# Patient Record
Sex: Female | Born: 1995 | Race: Black or African American | Hispanic: No | Marital: Single | Smoking: Never smoker
Health system: Southern US, Community
[De-identification: ages and names within clinical notes are randomized; demographics above are authoritative.]

## PROBLEM LIST (undated history)

## (undated) DIAGNOSIS — E8881 Metabolic syndrome: Secondary | ICD-10-CM

## (undated) DIAGNOSIS — E669 Obesity, unspecified: Secondary | ICD-10-CM

## (undated) DIAGNOSIS — I10 Essential (primary) hypertension: Secondary | ICD-10-CM

## (undated) DIAGNOSIS — T7840XA Allergy, unspecified, initial encounter: Secondary | ICD-10-CM

## (undated) DIAGNOSIS — R7303 Prediabetes: Secondary | ICD-10-CM

## (undated) DIAGNOSIS — E88819 Insulin resistance, unspecified: Secondary | ICD-10-CM

## (undated) DIAGNOSIS — J351 Hypertrophy of tonsils: Secondary | ICD-10-CM

## (undated) DIAGNOSIS — E119 Type 2 diabetes mellitus without complications: Secondary | ICD-10-CM

## (undated) HISTORY — PX: HERNIA REPAIR: SHX51

## (undated) HISTORY — DX: Insulin resistance, unspecified: E88.819

## (undated) HISTORY — DX: Metabolic syndrome: E88.81

---
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
## 2017-04-16 ENCOUNTER — Ambulatory Visit (INDEPENDENT_AMBULATORY_CARE_PROVIDER_SITE_OTHER): Payer: No Typology Code available for payment source | Admitting: Family

## 2017-04-16 ENCOUNTER — Encounter (INDEPENDENT_AMBULATORY_CARE_PROVIDER_SITE_OTHER): Payer: Self-pay | Admitting: Family

## 2017-04-16 VITALS — BP 110/70 | HR 76 | Wt 273.2 lb

## 2017-04-16 DIAGNOSIS — R7303 Prediabetes: Secondary | ICD-10-CM | POA: Diagnosis not present

## 2017-04-16 DIAGNOSIS — L83 Acanthosis nigricans: Secondary | ICD-10-CM | POA: Diagnosis not present

## 2017-04-16 DIAGNOSIS — E8881 Metabolic syndrome: Secondary | ICD-10-CM

## 2017-04-16 LAB — POCT GLUCOSE (DEVICE FOR HOME USE): POC Glucose: 71 mg/dl (ref 70–99)

## 2017-04-16 LAB — POCT GLYCOSYLATED HEMOGLOBIN (HGB A1C): Hemoglobin A1C: 5.8

## 2017-04-16 MED ORDER — METFORMIN HCL ER 750 MG PO TB24: 1500.0000 mg | ORAL_TABLET | Freq: Every day | ORAL | 4 refills | Status: DC

## 2017-04-16 NOTE — Progress Notes (Signed)
Subjective:  Patient Name: Angela Tate Date of Birth: 06-10-1996  MRN: [id number]  Angela Tate  presents to the office today for follow-up evaluation and management of her insulin resistance, obesity, prediabetes   HISTORY OF PRESENT ILLNESS:   Angela Tate is a 21 y.o. AA female   Angela Tate was accompanied by herself.  1. Angela Tate was first diagnosed with insulin resistance when she was about 21 yo. She was started on the Metformin by her PMD (then Dr. Hart Rochester at [facility]) when she was diagnosed. Per mom she had some abnormal blood work with a fasting insulin level which was elevated and an A1C which mom cannot recall. Since diagnosis mom thinks that she has continued to gain weight. She was seen at [facility] in their clinic once for concerns regarding insulin resistance and prediabetes. However, they had a change in insurance and it was too expensive and difficult to follow up at [facility] and they were referred to our clinic for further evaluation and management.    2. The patient's last [facility] visit was on 04/2016. In the interim, she has been generally healthy.   Angela Tate has not been seen in clinic since 04/2016. 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 She reports that she has started taking Metfromin more often, she estimates she takes it 4 days per week. She denies any GI upset with Metformin. She has improved her diet by eliminating most sugar drinks, she still drinks 1 glass of sugar Cool-Aide per day. She eats fast food 1 time per week and otherwise eats home cooked meals.   Angela Tate's only complaint is that over the last week her feet have been falling asleep when she is having bowel movements. She states that it last for about 2 minutes and then is normal again. She sits with her feet crossed while using bathroom.    Diet Review  B: Frosted Flakes cereal with milk  L: Ham sandwich with a bag of chips and water S: Oreos  D: Baked chicken with potatoes.   3.  Pertinent Review of Systems:  Constitutional: The patient feels "good". The patient seems healthy and active. Eyes: Vision seems to be good. There are no recognized eye problems. Neck: The patient has no complaints of anterior neck swelling, soreness, tenderness, pressure, discomfort, or difficulty swallowing.   Heart: Heart rate increases with exercise or other physical activity. The patient has no complaints of palpitations, irregular heart beats, chest pain, or chest pressure.   Gastrointestinal: Bowel movents seem normal. The patient has no complaints of excessive hunger, acid reflux, upset stomach, stomach aches or pains, or constipation. Legs: Muscle mass and strength seem normal. There are no complaints of numbness, tingling, burning, or pain. No edema is noted.  Feet: There are no obvious foot problems. There are no complaints of numbness, tingling, burning, or pain. No edema is noted. Neurologic: There are no recognized problems with muscle movement and strength, sensation, or coordination. Reports foot "falling asleep" when having bowel movement.  Endocrine: Denies polyuria and polydipsia.  GYN/GU: no cycles on Nexplanon  PAST MEDICAL, FAMILY, AND SOCIAL HISTORY  Past Medical History:  Diagnosis Date  . Allergy   . Insulin resistance   . Obesity   . Prediabetes   . Tonsillar hypertrophy     Family History  Problem Relation Age of Onset  . Obesity Mother   . Asthma Father   . Diabetes Maternal Grandfather   . Heart disease Maternal Grandfather   .  Hyperlipidemia Maternal Grandfather   . Hypertension Maternal Grandfather   . Stroke Maternal Grandfather   . Diabetes Paternal Uncle   . Cancer Paternal Uncle   . Alcohol abuse Neg Hx   . Arthritis Neg Hx   . Birth defects Neg Hx   . COPD Neg Hx   . Depression Neg Hx   . Drug abuse Neg Hx   . Early death Neg Hx   . Hearing loss Neg Hx   . Kidney disease Neg Hx   . Learning disabilities Neg Hx   . Mental illness Neg Hx    . Mental retardation Neg Hx   . Miscarriages / Stillbirths Neg Hx   . Vision loss Neg Hx   . Varicose Veins Neg Hx      Current Outpatient Prescriptions:  .  metFORMIN (GLUCOPHAGE XR) 750 MG 24 hr tablet, Take 2 tablets (1,500 mg total) by mouth daily with supper., Disp: 60 tablet, Rfl: 4  Allergies as of 04/16/2017  . (No Known Allergies)     reports that she has never smoked. She has never used smokeless tobacco. She reports that she does not drink alcohol or use drugs. Pediatric History  Patient Guardian Status  . Mother:  Angela Tate,Angela Tate   Other Topics Concern  . Not on file   Social History Narrative   Lives with mom and brother. Dad involved sporadically. 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   Objective:  Vital Signs:  BP 110/70   Pulse 76   Wt 273 lb 3.2 oz (123.9 kg)   BMI 46.53 kg/m  No height on file for this encounter.   Ht Readings from Last 3 Encounters:  11/09/16 5' 4.25" (1.632 m) (50 %, Z= 0.00)*  04/27/16 5\' 4"  (1.626 m) (47 %, Z= -0.08)*  04/24/16 5' 4.96" (1.65 m) (62 %, Z= 0.29)*   * Growth percentiles are based on CDC 2-20 Years data.   Wt Readings from Last 3 Encounters:  04/16/17 273 lb 3.2 oz (123.9 kg) (>99 %, Z= 2.62)*  11/09/16 276 lb (125.2 kg) (>99 %, Z= 2.61)*  04/27/16 266 lb (120.7 kg) (>99 %, Z= 2.55)*   * Growth percentiles are based on CDC 2-20 Years data.   HC Readings from Last 3 Encounters:  [facility]   Body surface area is 2.37 meters squared. No height on file for this encounter. >99 %ile (Z= 2.62) based on CDC 2-20 Years weight-for-age data using vitals from 04/16/2017.    PHYSICAL EXAM: General: Well developed, well nourished but morbidly  obese female in no acute distress.  Appears stated age Head: Normocephalic, atraumatic.   Eyes:  Pupils equal and round. EOMI.   Sclera white.  No eye drainage.   Ears/Nose/Mouth/Throat: Nares patent, no nasal drainage.  Normal dentition, mucous membranes moist.  Oropharynx intact. Neck: supple, no cervical lymphadenopathy, no thyromegaly Cardiovascular: regular rate, normal S1/S2, no murmurs Respiratory: No increased work of breathing.  Lungs clear to auscultation bilaterally.  No wheezes. Abdomen: soft, nontender, nondistended. Normal bowel sounds.  No appreciable masses  Extremities: warm, well perfused, cap refill < 2 sec.   Musculoskeletal: Normal muscle mass.  Normal strength Skin: warm, dry.  No rash or lesions.  + Acanthosis to posterior  neck and axilla.  Neurologic: alert and oriented, normal speech and gait. Normal sensation to bilateral feet.     LAB DATA:   Results for orders placed or performed in visit on 04/16/17 (from the past 504 hour(s))  POCT Glucose (Device for Home Use)   Collection Time: 04/16/17  2:47 PM  Result Value Ref Range   Glucose Fasting, POC  70 - 99 mg/dL   POC Glucose 71 70 - 99 mg/dl  POCT HgB W0J   Collection Time: 04/16/17  2:58 PM  Result Value Ref Range   Hemoglobin A1C 5.8      Assessment and Plan:   ASSESSMENT:   1. Prediabetes- A1C has improved from 6.1 at last visit to 5.8% today. She is taking Metformin more consistently  2. Obesity- 3 pound weight loss. She has improved diet. She is not exercising currently.  3. Acanthosis- consistent with insulin resistance Has worsened since last visit.     PLAN:  1. Diagnostic: A1C as above. Annual labs at next visit.  2. Therapeutic: Continue Metformin XR 1500 mg daily. Encouraged her to take EVERYDAY   - lifestyle modifications. Encouraged to begin with 15 minutes of exercise 5 days per week.  3. Patient education: Discussed Prediabetes and T2DM. Reviewed her A1c and what that signifies.  Discussed lifestyle changes and set goals of 15 minutes of exercise. Reviewed healthy diet plan. Answered all questions.  4. Follow-up: 4 months     Gretchen Short, FNP-C    Level of Service: This visit lasted in excess of 25 minutes. More than 50% of the visit was devoted to counseling.

## 2017-04-16 NOTE — Patient Instructions (Signed)
-   Continue Metformin 1500 mg XL  - Exercise at least 15 minutes per day, 5 days per week  - No sugar drinks  - Limit fast food and junk food  - Follow up in 4 months

## 2017-08-19 ENCOUNTER — Ambulatory Visit (INDEPENDENT_AMBULATORY_CARE_PROVIDER_SITE_OTHER): Payer: No Typology Code available for payment source | Admitting: Family

## 2017-08-19 VITALS — BP 118/70 | HR 90 | Wt 273.2 lb

## 2017-08-19 DIAGNOSIS — R7303 Prediabetes: Secondary | ICD-10-CM

## 2017-08-19 DIAGNOSIS — L83 Acanthosis nigricans: Secondary | ICD-10-CM

## 2017-08-19 DIAGNOSIS — Z68.41 Body mass index (BMI) pediatric, greater than or equal to 95th percentile for age: Secondary | ICD-10-CM

## 2017-08-19 LAB — POCT GLYCOSYLATED HEMOGLOBIN (HGB A1C): Hemoglobin A1C: 5.9

## 2017-08-19 LAB — POCT GLUCOSE (DEVICE FOR HOME USE): POC Glucose: 58 mg/dl — AB (ref 70–99)

## 2017-08-19 NOTE — Patient Instructions (Signed)
-   Take Metformin every night  - Exercise for 10 minutes every day.  - Cut out sugar drinks- Diet is ok.   - Adult endocrinology is   - [address], [place], [place] [address]  - Phone: [phone]  - Adult patients are able to stay with pediatric until finishing college.   - Follow up in 3 months.

## 2017-08-20 ENCOUNTER — Encounter (INDEPENDENT_AMBULATORY_CARE_PROVIDER_SITE_OTHER): Payer: Self-pay | Admitting: Family

## 2017-08-20 NOTE — Progress Notes (Signed)
Subjective:  Patient Name: Angela Tate Date of Birth: 07/16/1996  MRN: [id number]  Angela Tate  presents to the office today for follow-up evaluation and management of her insulin resistance, obesity, prediabetes   HISTORY OF PRESENT ILLNESS:   Angela Tate is a 21 y.o. AA female   Angela Tate was accompanied by herself.  1. Angela Tate was first diagnosed with insulin resistance when she was about 21 yo. She was started on the Metformin by her PMD (then [facility]) when she was diagnosed. Per mom she had some abnormal blood work with a fasting insulin level which was elevated and an A1C which mom cannot recall. Since diagnosis mom thinks that she has continued to gain weight. [facility] clinic [facility] for concerns regarding insulin resistance and prediabetes. However, they had a change in insurance and it was too expensive and difficult to follow up at [facility] and they were referred to our clinic for further evaluation and management.    2. The patient's last [facility] visit was on 03/2017. In the interim, she has been generally healthy.   Angela Tate reports that she is very busy with school and has not had much time to focus on diet and exercise. She states that the only exercise she gets is on days when she has school and has to walk to various buildings on campus. When she is home she spends time studying and watching TV. She has not been able to find much motivation to exercise.   She reports that she has started drinking a lot more sugar drinks lately. She is drinking 2-3 sugar drinks per day. She eats fast food at least one time per day. For dinner she makes spaghetti almost every night because it is her favorite food. She eats fruit snacks and rice krispy treats twice per day for snack.   She is suppose to take Metformin nightly. She reports that she is only taking Metformin about 3 days per week.     3. Pertinent Review of Systems:  Constitutional: The patient feels  "ok". The patient seems healthy and active. Eyes: Vision seems to be good. There are no recognized eye problems. Neck: The patient has no complaints of anterior neck swelling, soreness, tenderness, pressure, discomfort, or difficulty swallowing.   Heart: Heart rate increases with exercise or other physical activity. The patient has no complaints of palpitations, irregular heart beats, chest pain, or chest pressure.   Gastrointestinal: Bowel movents seem normal. The patient has no complaints of excessive hunger, acid reflux, upset stomach, stomach aches or pains, or constipation. Legs: Muscle mass and strength seem normal. There are no complaints of numbness, tingling, burning, or pain. No edema is noted.  Feet: There are no obvious foot problems. There are no complaints of numbness, tingling, burning, or pain. No edema is noted. Neurologic: There are no recognized problems with muscle movement and strength, sensation, or coordination.  Endocrine: Denies polyuria and polydipsia.  GYN/GU: no cycles on Nexplanon  PAST MEDICAL, FAMILY, AND SOCIAL HISTORY  Past Medical History:  Diagnosis Date  . Allergy   . Insulin resistance   . Obesity   . Prediabetes   . Tonsillar hypertrophy     Family History  Problem Relation Age of Onset  . Obesity Mother   . Asthma Father   . Diabetes Maternal Grandfather   . Heart disease Maternal Grandfather   . Hyperlipidemia Maternal Grandfather   . Hypertension Maternal Grandfather   . Stroke Maternal Grandfather   .  Diabetes Paternal Uncle   . Cancer Paternal Uncle   . Alcohol abuse Neg Hx   . Arthritis Neg Hx   . Birth defects Neg Hx   . COPD Neg Hx   . Depression Neg Hx   . Drug abuse Neg Hx   . Early death Neg Hx   . Hearing loss Neg Hx   . Kidney disease Neg Hx   . Learning disabilities Neg Hx   . Mental illness Neg Hx   . Mental retardation Neg Hx   . Miscarriages / Stillbirths Neg Hx   . Vision loss Neg Hx   . Varicose Veins Neg Hx       Current Outpatient Medications:  .  metFORMIN (GLUCOPHAGE XR) 750 MG 24 hr tablet, Take 2 tablets (1,500 mg total) by mouth daily with supper., Disp: 60 tablet, Rfl: 4  Allergies as of 08/19/2017  . (No Known Allergies)     reports that  has never smoked. she has never used smokeless tobacco. She reports that she does not drink alcohol or use drugs. Pediatric History  Patient Guardian Status  . Mother:  Angela Tate   Other Topics Concern  . Not on file  Social History Narrative   Lives with mom and brother. Dad involved sporadically. 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   Objective:  Vital Signs:  BP 118/70   Pulse 90   Wt 273 lb 3.2 oz (123.9 kg)   BMI 46.53 kg/m  No height on file for this encounter.   Ht Readings from Last 3 Encounters:  11/09/16 5' 4.25" (1.632 m) (50 %, Z= 0.00)*  04/27/16 5\' 4"  (1.626 m) (47 %, Z= -0.08)*  04/24/16 5' 4.96" (1.65 m) (62 %, Z= 0.29)*   * Growth percentiles are based on CDC (Girls, 2-20 Years) data.   Wt Readings from Last 3 Encounters:  08/19/17 273 lb 3.2 oz (123.9 kg) (>99 %, Z= 2.65)*  04/16/17 273 lb 3.2 oz (123.9 kg) (>99 %, Z= 2.62)*  11/09/16 276 lb (125.2 kg) (>99 %, Z= 2.61)*   * Growth percentiles are based on CDC (Girls, 2-20 Years) data.   HC Readings from Last 3 Encounters:  No data found for [name]   Body surface area is 2.37 meters squared. No height on file for this encounter. >99 %ile (Z= 2.65) based on CDC (Girls, 2-20 Years) weight-for-age data using vitals from 08/19/2017.    PHYSICAL EXAM: General: Well developed, well nourished but morbidly obese female in no acute distress.  Appears stated age. She is alert and oriented.  Head:  Normocephalic, atraumatic.   Eyes:  Pupils equal and round. EOMI.   Sclera white.  No eye drainage.   Ears/Nose/Mouth/Throat: Nares patent, no nasal drainage.  Normal dentition, mucous membranes moist.  Oropharynx intact. Neck: supple, no cervical lymphadenopathy, no thyromegaly Cardiovascular: regular rate, normal S1/S2, no murmurs Respiratory: No increased work of breathing.  Lungs clear to auscultation bilaterally.  No wheezes. Abdomen: soft, nontender, nondistended. Normal bowel sounds.  No appreciable masses  Extremities: warm, well perfused, cap refill < 2 sec.   Musculoskeletal: Normal muscle mass.  Normal strength Skin: warm, dry.  No rash or lesions.  + Acanthosis to posterior neck and axilla.  Neurologic: alert and oriented,  normal speech and gait. Normal sensation to bilateral feet.     LAB DATA:   Results for orders placed or performed in visit on 08/19/17 (from the past 504 hour(s))  POCT Glucose (Device for Home Use)   Collection Time: 08/19/17  3:49 PM  Result Value Ref Range   Glucose Fasting, POC  70 - 99 mg/dL   POC Glucose 58 (A) 70 - 99 mg/dl  POCT HgB [id number]   Collection Time: 08/19/17  3:58 PM  Result Value Ref Range   Hemoglobin A1C 5.9      Assessment and Plan:   ASSESSMENT:   1. Prediabetes- Her A1c has increased from 5.8% to 5.9% today. She is not taking Metformin consistently.  2. Obesity- Her weight is stable at 273 pounds. She is >99th% for weight and BMI. She has not made improvements to diet and exercise since last visit.  3. Acanthosis- Stable and consistent with insulin resistance.  4. Hypoglycemia: low glucose today in clinic. She has not eaten in over 14 hours. Given juice in clinic.     PLAN:  1. Diagnostic: Glucose and A1c as above. Annual labs ordered.  2. Therapeutic: Take 1500 mg of Metformin XR daily   - Exercise at least 10 minutes per day with goal of 1 hour.   - Eliminate all sugar drinks form diet.  3. Patient education: Reviewed  growth chart. Discussed pathophysiology of T2DM/Prediabtes and insulin resistance. Discussed A1c. Stressed the importance of making lifestyle changes and taking Metformin daily. Reviewed diet extensively and made suggestions for improvements. Advised to start with 10 minutes of exercise per day and increase gradually. Answered questions.  4. Follow-up: 3 months   LOS: This visit lasted >25 minutes. More then 50% of the visit was devoted to counseling.   Gretchen ShortSpenser Erie Radu, FNP-C

## 2017-11-20 ENCOUNTER — Ambulatory Visit (INDEPENDENT_AMBULATORY_CARE_PROVIDER_SITE_OTHER): Payer: Self-pay | Admitting: Family

## 2017-11-22 ENCOUNTER — Ambulatory Visit (INDEPENDENT_AMBULATORY_CARE_PROVIDER_SITE_OTHER): Payer: 59 | Admitting: Family

## 2017-11-22 ENCOUNTER — Encounter (INDEPENDENT_AMBULATORY_CARE_PROVIDER_SITE_OTHER): Payer: Self-pay | Admitting: Family

## 2017-11-22 VITALS — BP 126/84 | HR 90 | Ht 64.17 in | Wt 283.0 lb

## 2017-11-22 DIAGNOSIS — Z68.41 Body mass index (BMI) pediatric, greater than or equal to 95th percentile for age: Secondary | ICD-10-CM

## 2017-11-22 DIAGNOSIS — L83 Acanthosis nigricans: Secondary | ICD-10-CM | POA: Diagnosis not present

## 2017-11-22 DIAGNOSIS — E8881 Metabolic syndrome: Secondary | ICD-10-CM

## 2017-11-22 DIAGNOSIS — R7303 Prediabetes: Secondary | ICD-10-CM

## 2017-11-22 LAB — POCT GLYCOSYLATED HEMOGLOBIN (HGB A1C): Hemoglobin A1C: 5.7

## 2017-11-22 LAB — POCT GLUCOSE (DEVICE FOR HOME USE): POC Glucose: 148 mg/dl — AB (ref 70–99)

## 2017-11-22 MED ORDER — METFORMIN HCL ER 750 MG PO TB24: 1500.0000 mg | ORAL_TABLET | Freq: Every day | ORAL | 4 refills | Status: DC

## 2017-11-22 NOTE — Patient Instructions (Signed)
Continue metformin  Take every day   - Exercise at least 20 minutes 3 days per week.   - 2 days a week--> 5-10 minutes of exercise.   - Cut out sugar drinks   - Diet drinks are fine. 0 calorie, 0 sugar

## 2017-11-24 ENCOUNTER — Encounter (INDEPENDENT_AMBULATORY_CARE_PROVIDER_SITE_OTHER): Payer: Self-pay | Admitting: Family

## 2017-11-24 NOTE — Progress Notes (Signed)
Subjective:  Patient Name: Tareva Leske Date of Birth: [birth year]  MRN: [id number]  Delilah Mulgrew  presents to the office today for follow-up evaluation and management of her insulin resistance, obesity, prediabetes   HISTORY OF PRESENT ILLNESS:   Marijean is a 22 y.o. AA female   Sanae was accompanied by herself.  1. Tomorrow was first diagnosed with insulin resistance when she was about 22 yo. She was started on the Metformin by her PMD (then Dr. Hart Rochester at [facility]) when she was diagnosed. Per mom she had some abnormal blood work with a fasting insulin level which was elevated and an A1C which mom cannot recall. Since diagnosis mom thinks that she has continued to gain weight. She was seen at [facility] in their clinic once for concerns regarding insulin resistance and prediabetes. However, they had a change in insurance and it was too expensive and difficult to follow up at [facility] and they were referred to our clinic for further evaluation and management.    2. The patient's last [facility] visit was on 07/2017. In the interim, she has been generally healthy.   Marnee has been well, she is working hard at [employer]. She is very busy with school but reports that she has not worked to make many changes to her lifestyle. She believe that for about 1 month after her last appointment she tried to exercise almost every day. She was unable to maintain it and now she goes for a walk about 1 time per week. She felt much better when she was exercising and hopes to restart. She states that her diet has not been very good. She eats fast food 5-6 times per week. She is also drinking 3-4 sugar soda's per day. She finds modifying her diet to be the hardest change.   She is suppose to take 1500 mg of Metformin daily. However, she states that it upsets her stomach so she only takes it about 3 times per week.     3. Pertinent Review of Systems:  Constitutional: The patient feels "good". The patient seems healthy and  active. Eyes: Vision seems to be good. There are no recognized eye problems. Neck: The patient has no complaints of anterior neck swelling, soreness, tenderness, pressure, discomfort, or difficulty swallowing.   Heart: Heart rate increases with exercise or other physical activity. The patient has no complaints of palpitations, irregular heart beats, chest pain, or chest pressure.   Gastrointestinal: Bowel movents seem normal. The patient has no complaints of excessive hunger, acid reflux, upset stomach, stomach aches or pains, or constipation. Legs: Muscle mass and strength seem normal. There are no complaints of numbness, tingling, burning, or pain. No edema is noted.  Feet: There are no obvious foot problems. There are no complaints of numbness, tingling, burning, or pain. No edema is noted. Neurologic: There are no recognized problems with muscle movement and strength, sensation, or coordination.  Endocrine: Denies polyuria and polydipsia.  GYN/GU: no cycles on Nexplanon  PAST MEDICAL, FAMILY, AND SOCIAL HISTORY  Past Medical History:  Diagnosis Date  . Allergy   . Insulin resistance   . Obesity   . Prediabetes   . Tonsillar hypertrophy     Family History  Problem Relation Age of Onset  . Obesity Mother   . Asthma Father   . Diabetes Maternal Grandfather   . Heart disease Maternal Grandfather   . Hyperlipidemia Maternal Grandfather   . Hypertension Maternal Grandfather   . Stroke Maternal Grandfather   . Diabetes  Paternal Uncle   . Cancer Paternal Uncle   . Alcohol abuse Neg Hx   . Arthritis Neg Hx   . Birth defects Neg Hx   . COPD Neg Hx   . Depression Neg Hx   . Drug abuse Neg Hx   . Early death Neg Hx   . Hearing loss Neg Hx   . Kidney disease Neg Hx   . Learning disabilities Neg Hx   . Mental illness Neg Hx   . Mental retardation Neg Hx   . Miscarriages / Stillbirths Neg Hx   . Vision loss Neg Hx   . Varicose Veins Neg Hx      Current Outpatient Medications:   .  metFORMIN (GLUCOPHAGE XR) 750 MG 24 hr tablet, Take 2 tablets (1,500 mg total) by mouth daily with supper., Disp: 60 tablet, Rfl: 4  Allergies as of 11/22/2017  . (No Known Allergies)     reports that  has never smoked. she has never used smokeless tobacco. She reports that she does not drink alcohol or use drugs. Pediatric History  Patient Guardian Status  . Mother:  [name]   Other Topics Concern  . Not on file  Social History Narrative   Lives with mom and brother. Dad involved sporadically. 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   Objective:  Vital Signs:  BP 126/84   Pulse 90   Ht 5' 4.17" (1.63 m)   Wt 283 lb (128.4 kg)   BMI 48.32 kg/m  Blood pressure percentiles are 89 % systolic and 98 % diastolic based on the August 2017 AAP Clinical Practice Guideline. This reading is in the Stage 1 hypertension range (BP >= 130/80).   Ht Readings from Last 3 Encounters:  11/22/17 5' 4.17" (1.63 m) (48 %, Z= -0.05)*  11/09/16 5' 4.25" (1.632 m) (50 %, Z= 0.00)*  04/27/16 5\' 4"  (1.626 m) (47 %, Z= -0.08)*   * Growth percentiles are based on CDC (Girls, 2-20 Years) data.   Wt Readings from Last 3 Encounters:  11/22/17 283 lb (128.4 kg) (>99 %, Z= 2.73)*  08/19/17 273 lb 3.2 oz (123.9 kg) (>99 %, Z= 2.65)*  04/16/17 273 lb 3.2 oz (123.9 kg) (>99 %, Z= 2.62)*   * Growth percentiles are based on CDC (Girls, 2-20 Years) data.   HC Readings from Last 3 Encounters:  No data found for [facility]   Body surface area is 2.41 meters squared. 48 %ile (Z= -0.05) based on CDC (Girls, 2-20 Years) Stature-for-age data based on Stature recorded on 11/22/2017. >99 %ile (Z= 2.73) based on CDC (Girls, 2-20 Years) weight-for-age  data using vitals from 11/22/2017.    PHYSICAL EXAM: General: Well developed, well nourished but morbidly obese female in no acute distress. She is alert and oriented.  Head: Normocephalic, atraumatic.   Eyes:  Pupils equal and round. EOMI.   Sclera white.  No eye drainage.   Ears/Nose/Mouth/Throat: Nares patent, no nasal drainage.  Normal dentition, mucous membranes moist.  Oropharynx intact. Neck: supple, no cervical lymphadenopathy, no thyromegaly Cardiovascular: regular rate, normal S1/S2, no murmurs Respiratory: No increased work of breathing.  Lungs clear to auscultation bilaterally.  No wheezes. Abdomen: soft, nontender, nondistended. Normal bowel sounds.  No appreciable masses  Extremities: warm, well  perfused, cap refill < 2 sec.   Musculoskeletal: Normal muscle mass.  Normal strength Skin: warm, dry.  No rash or lesions.  + Acanthosis to posterior neck and axilla.  Neurologic: alert and oriented, normal speech and gait. Normal sensation to bilateral feet.     LAB DATA:   Results for orders placed or performed in visit on 11/22/17 (from the past 504 hour(s))  POCT Glucose (Device for Home Use)   Collection Time: 11/22/17 10:57 AM  Result Value Ref Range   Glucose Fasting, POC  70 - 99 mg/dL   POC Glucose 621148 (A) 70 - 99 mg/dl  POCT HgB H0QA1C   Collection Time: 11/22/17 11:08 AM  Result Value Ref Range   Hemoglobin A1C 5.7      Assessment and Plan:   ASSESSMENT: Sidney AceKiarah is a 22 y.o. Female with prediabetes, morbid obesity, and acanthosis. She has not made lifestyle changes since her last visit. She has also struggled to take her Metformin consistently. Her Hemoglobin A1c remains in the prediabetes range at 5.7%. She has gained 10 pounds since her last visit and her BMI is >99%ile.   1. Prediabetes/Acanthosis/obesity  - Take 1500mg  of Metformin ER daily. Discussed taking it after she eats.   - Encouraged to take probiotic to help with GI upset  - Discussed importance of  Metformin to decrease insulin resistance and help lower A1c  - Advised to exercise at least 30 minutes per day. Her ultimate goal is 1 hour per day  - Reviewed diet extensively and made suggestions for changes.   - Cut out sugar drinks is number 1 priority at this point.  - Discussed growth chart and weight trend.  - Answered questions.    4. Follow-up: 3 months   LOS: This visit lasted >25 minutes. More then 50% of the visit was devoted to counseling and education.   Gretchen ShortSpenser Avianna Moynahan, FNP-C

## 2018-01-22 ENCOUNTER — Other Ambulatory Visit (INDEPENDENT_AMBULATORY_CARE_PROVIDER_SITE_OTHER): Payer: Self-pay | Admitting: *Deleted

## 2018-01-22 MED ORDER — METFORMIN HCL ER 750 MG PO TB24: ORAL_TABLET | ORAL | 1 refills | Status: AC

## 2018-03-24 ENCOUNTER — Ambulatory Visit (INDEPENDENT_AMBULATORY_CARE_PROVIDER_SITE_OTHER): Payer: 59 | Admitting: Family

## 2019-04-02 ENCOUNTER — Emergency Department (HOSPITAL_COMMUNITY)
Admission: EM | Admit: 2019-04-02 | Discharge: 2019-04-02 | Disposition: A | Discharge status: Home / Self Care | Payer: 59 | Attending: Emergency Medicine | Admitting: Emergency Medicine

## 2019-04-02 ENCOUNTER — Other Ambulatory Visit: Payer: Self-pay

## 2019-04-02 ENCOUNTER — Emergency Department (HOSPITAL_COMMUNITY): Payer: 59

## 2019-04-02 DIAGNOSIS — Y9301 Activity, walking, marching and hiking: Secondary | ICD-10-CM | POA: Diagnosis not present

## 2019-04-02 DIAGNOSIS — Y999 Unspecified external cause status: Secondary | ICD-10-CM | POA: Insufficient documentation

## 2019-04-02 DIAGNOSIS — Y92014 Private driveway to single-family (private) house as the place of occurrence of the external cause: Secondary | ICD-10-CM | POA: Insufficient documentation

## 2019-04-02 DIAGNOSIS — S93401A Sprain of unspecified ligament of right ankle, initial encounter: Secondary | ICD-10-CM

## 2019-04-02 DIAGNOSIS — W1830XA Fall on same level, unspecified, initial encounter: Secondary | ICD-10-CM | POA: Diagnosis not present

## 2019-04-02 DIAGNOSIS — M25571 Pain in right ankle and joints of right foot: Secondary | ICD-10-CM | POA: Diagnosis present

## 2019-04-02 NOTE — ED Triage Notes (Signed)
Pt  Here from home with c/o right ankle pain after rolling it  On her way to work

## 2019-04-02 NOTE — Discharge Instructions (Addendum)
Your x-ray showed soft tissue swelling on the outside of your ankle, no bone injury  You likely have a sprained ligament of your ankle  For pain and inflammation you can use a combination of ibuprofen and acetaminophen.  Take [phone] mg acetaminophen (tylenol) every 6 hours or 600 mg ibuprofen (advil, motrin) every 6 hours.  You can take these separately or combine them every 6 hours for maximum pain control. Do not exceed 4,000 mg acetaminophen or 2,400 mg ibuprofen in a 24 hour period.  Do not take ibuprofen containing products if you are pregnant or have history of kidney disease, ulcers, GI bleeding, severe acid reflux, or take a blood thinner.  Do not take acetaminophen if you have liver disease.   Ice for 20  mins three times a day (morning, afternoon, evening.)  Elevate your foot  Start doing range of motion exercises on day 3 to help loosen up the join and avoid weakness

## 2019-04-02 NOTE — ED Notes (Signed)
Patient transported to X-ray 

## 2019-04-02 NOTE — Progress Notes (Signed)
Orthopedic Tech Progress Note Patient Details:  VIRGIL SLINGER Aug 03, 1996 [id number]  Ortho Devices Type of Ortho Device: ASO, Crutches Ortho Device/Splint Location: LRE Ortho Device/Splint Interventions: Adjustment, Application   Post Interventions Patient Tolerated: Well, Ambulated well Instructions Provided: Care of device, Adjustment of device   Janit Pagan 04/02/2019, 1:29 PM

## 2019-04-02 NOTE — ED Provider Notes (Signed)
[facility] EMERGENCY [facility] Provider Note   CSN: [id number] Arrival date & time: 04/02/19  1109    History   Chief Complaint No chief complaint on file.   HPI Angela Tate is a 23 y.o. female presents to the ER for evaluation of right ankle pain.  Sudden onset earlier this morning.  Associated with mild swelling, pain with walking and weightbearing.  States she was walking down her driveway and slipped and fell inverting her ankle.  Apparently they recently refinished her driveway and thinks the material is still not fully set.  She denies any other physical injuries from the fall.  She denies any distal tingling or numbness, calf pain or swelling, joint redness or warmth, fevers.  No interventions.  No alleviating factors.  Aggravated with palpation, weightbearing.  No previous history of ankle injuries or surgeries.     HPI  Past Medical History:  Diagnosis Date  . Allergy   . Insulin resistance   . Obesity   . Prediabetes   . Tonsillar hypertrophy     Patient Active Problem List   Diagnosis Date Noted  . Tonsillar hypertrophy 04/27/2016  . Obstructive sleep apnea syndrome 11/23/2015  . Well adolescent visit 11/08/2015  . Prediabetes 11/05/2014  . BMI (body mass index), pediatric, > 99% for age 80/12/2014  . Acanthosis 01/20/2013  . Elevated blood pressure (not hypertension) 01/20/2013  . Well child check 10/09/2012  . Dysthymia 10/07/2012  . Body image disturbance 10/07/2012  . Dyspepsia 04/01/2012  . Impaired glucose tolerance 10/03/2011  . Obesity 10/03/2011  . Insulin resistance 07/26/2011    Class: Diagnosis of    Past Surgical History:  Procedure Laterality Date  . HERNIA REPAIR    . TONSILLECTOMY Bilateral 04/27/2016   Procedure: TONSILLECTOMY;  Surgeon: Christia Readingwight Bates, MD;  [facility];  Service: ENT;  Laterality: Bilateral;  30 mins     OB History   No obstetric history on file.      Home Medications    Prior to  Admission medications   Medication Sig Start Date End Date Taking? Authorizing Provider  metFORMIN (GLUCOPHAGE XR) 750 MG 24 hr tablet Take 2 tablets daily 01/22/18   Gretchen ShortBeasley, Spenser, NP    Family History Family History  Problem Relation Age of Onset  . Obesity Mother   . Asthma Father   . Diabetes Maternal Grandfather   . Heart disease Maternal Grandfather   . Hyperlipidemia Maternal Grandfather   . Hypertension Maternal Grandfather   . Stroke Maternal Grandfather   . Diabetes Paternal Uncle   . Cancer Paternal Uncle   . Alcohol abuse Neg Hx   . Arthritis Neg Hx   . Birth defects Neg Hx   . COPD Neg Hx   . Depression Neg Hx   . Drug abuse Neg Hx   . Early death Neg Hx   . Hearing loss Neg Hx   . Kidney disease Neg Hx   . Learning disabilities Neg Hx   . Mental illness Neg Hx   . Mental retardation Neg Hx   . Miscarriages / Stillbirths Neg Hx   . Vision loss Neg Hx   . Varicose Veins Neg Hx     Social History Social History   Tobacco Use  . Smoking status: Never Smoker  . Smokeless tobacco: Never Used  Substance Use Topics  . Alcohol use: No  . Drug use: No     Allergies   Patient has no known allergies.  Review of Systems Review of Systems  Musculoskeletal: Positive for arthralgias and joint swelling.  All other systems reviewed and are negative.    Physical Exam Updated Vital Signs BP (!) 153/120   Pulse 90   Temp 98.5 F (36.9 C) (Oral)   Resp 18   Ht 5\' 3"  (1.6 m)   SpO2 100%   BMI 50.13 kg/m   Physical Exam Constitutional:      Appearance: She is well-developed.  HENT:     Head: Normocephalic.     Nose: Nose normal.  Eyes:     General: Lids are normal.  Neck:     Musculoskeletal: Normal range of motion.  Cardiovascular:     Rate and Rhythm: Normal rate.     Comments: 1+ DP pulses bilaterally.  Toes are well perfused.  No asymmetric calf edema or tenderness. Pulmonary:     Effort: Pulmonary effort is normal. No respiratory  distress.  Musculoskeletal: Normal range of motion.        General: Swelling and tenderness present.     Comments:  Right ankle: local edema, tenderness to right malleolus, calcaneofibular and anterior talofibular ligaments.  +Tilt test. No joint erythema, warmth, fluctuance. ROM is intact but painful with inversion. No bony tenderness to midfoot, metatarsal or toes.  Achilles tendon is non tender. Negative anterior and posterior drawer.  Negative syndesmosis squeeze test. Negative Thompson test.   Neurological:     Mental Status: She is alert.     Comments: Sensation to light touch and strength is intact in right lower extremity.  Psychiatric:        Behavior: Behavior normal.      ED Treatments / Results  Labs (all labs ordered are listed, but only abnormal results are displayed) Labs Reviewed - No data to display  EKG None  Radiology Dg Ankle Complete Right  Result Date: 04/02/2019 CLINICAL DATA:  Pain following fall with twisting injury EXAM: RIGHT ANKLE - COMPLETE 3+ VIEW COMPARISON:  None. FINDINGS: Frontal, oblique, and lateral views obtained. There is soft tissue swelling. There is no appreciable fracture or joint effusion. There is no appreciable joint space narrowing or erosion. Ankle mortise appears intact. IMPRESSION: Soft tissue swelling. No fracture or appreciable arthropathy. Ankle mortise appears intact. Electronically Signed   By: Bretta BangWilliam  Woodruff III M.D.   On: 04/02/2019 11:50    Procedures Procedures (including critical care time)  Medications Ordered in ED Medications - No data to display   Initial Impression / Assessment and Plan / ED Course  I have reviewed the triage vital signs and the nursing notes.  Pertinent labs & imaging results that were available during my care of the patient were reviewed by me and considered in my medical decision making (see chart for details).   DDX includes soft tissue injury possibly involving the calcaneofibular or ATF  ligaments.  Joint is stable.  No skin injuries.  Extremity is neurovascularly intact.  History is not consistent with infectious process, gout, septic arthritis.  X-ray reviewed and interpreted by me and radiologist reveals soft tissue swelling but no arthropathy.  Patient was placed in an ASO brace, crutches and discharged with symptomatic and supportive care.  PCP follow-up for persistent symptoms.  Return precautions given.  Discussed plan of care with patient and father on the phone who are comfortable with this plan.  Final Clinical Impressions(s) / ED Diagnoses   Final diagnoses:  Sprain of right ankle, unspecified ligament, initial encounter    ED Discharge  Orders    None       Arlean Hopping 04/02/19 1258    Lacretia Leigh, MD 04/03/19 [id number]

## 2019-04-13 ENCOUNTER — Other Ambulatory Visit: Payer: Self-pay

## 2019-04-13 ENCOUNTER — Encounter: Payer: Self-pay | Admitting: Podiatry

## 2019-04-13 ENCOUNTER — Ambulatory Visit (INDEPENDENT_AMBULATORY_CARE_PROVIDER_SITE_OTHER): Payer: 59 | Admitting: Podiatry

## 2019-04-13 ENCOUNTER — Ambulatory Visit (INDEPENDENT_AMBULATORY_CARE_PROVIDER_SITE_OTHER): Payer: 59

## 2019-04-13 VITALS — Temp 98.1°F

## 2019-04-13 DIAGNOSIS — S99911A Unspecified injury of right ankle, initial encounter: Secondary | ICD-10-CM

## 2019-04-13 DIAGNOSIS — T1490XA Injury, unspecified, initial encounter: Secondary | ICD-10-CM

## 2019-04-13 DIAGNOSIS — S93401A Sprain of unspecified ligament of right ankle, initial encounter: Secondary | ICD-10-CM

## 2019-04-13 DIAGNOSIS — R7309 Other abnormal glucose: Secondary | ICD-10-CM | POA: Insufficient documentation

## 2019-04-14 MED ORDER — IBUPROFEN 800 MG PO TABS: 800.0000 mg | ORAL_TABLET | Freq: Three times a day (TID) | ORAL | 0 refills | Status: AC | PRN

## 2019-04-14 NOTE — Progress Notes (Signed)
Subjective:   Patient ID: Angela Tate, female   DOB: 23 y.o.   MRN: [id number]   HPI 23 year old female presents the office with concerns of pain to her right ankle.  She states that she did slip off a curb and she twisted her ankle about 2 weeks ago.  She was seen in the emergency department where x-rays were taken which were negative.  She was placed on crutches for 72 hours as well as a brace which is been helpful but she still states that she is having some tenderness and she is walking differently.  She still has swelling to the outside aspect of her ankle.  She is been trying ice and elevate as well.  No recent falls otherwise.   Review of Systems  All other systems reviewed and are negative.  Past Medical History:  Diagnosis Date  . Allergy   . Insulin resistance   . Obesity   . Prediabetes   . Tonsillar hypertrophy     Past Surgical History:  Procedure Laterality Date  . HERNIA REPAIR    . TONSILLECTOMY Bilateral 04/27/2016   Procedure: TONSILLECTOMY;  Surgeon: Melida Quitter, MD;  Location: [facility] OR;  Service: ENT;  Laterality: Bilateral;  30 mins     Current Outpatient Medications:  .  metFORMIN (GLUCOPHAGE XR) 750 MG 24 hr tablet, Take 2 tablets daily, Disp: 180 tablet, Rfl: 1  No Known Allergies     Objective:  Physical Exam  General: AAO x3, NAD  Dermatological: Skin is warm, dry and supple bilateral. Nails x 10 are well manicured; remaining integument appears unremarkable at this time. There are no open sores, no preulcerative lesions, no rash or signs of infection present.  Vascular: Dorsalis Pedis artery and Posterior Tibial artery pedal pulses are 2/4 bilateral with immedate capillary fill time. Pedal hair growth present. No varicosities and no lower extremity edema present bilateral. There is no pain with calf compression, swelling, warmth, erythema.   Neruologic: Grossly intact via light touch bilateral. Vibratory intact via tuning fork bilateral. Protective  threshold with Semmes Wienstein monofilament intact to all pedal sites bilateral.  Musculoskeletal: There is tenderness palpation along the course of the ATFL and the distal half of the fibula.  No proximal tib-fib pain.  No pain to the medial ankle.  No pain to the talus, fifth metatarsal base or other areas of the foot.  Localized edema to the lateral aspect of the ankle.  Muscular strength 5/5 in all groups tested bilateral.  Gait: Unassisted, Nonantalgic.      Assessment:   Right ankle sprain     Plan:  -Treatment options discussed including all alternatives, risks, and complications -Etiology of symptoms were discussed -Repeat x-rays were obtained and reviewed of the foot and ankle.  No evidence of acute fracture but there is swelling present.  On the oblique view of the ankle distally there is a small radiolucent line however it is more of a shadow as opposed to fracture. -Given the amount of discomfort she is having I still recommend immobilization in a cam boot. -Prescribed ibuprofen 800 mg to take as needed.  Continue to ice elevate. -We will start physical therapy next week.  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  Trula Slade DPM

## 2019-05-04 ENCOUNTER — Ambulatory Visit (INDEPENDENT_AMBULATORY_CARE_PROVIDER_SITE_OTHER): Payer: 59 | Admitting: Podiatry

## 2019-05-04 ENCOUNTER — Other Ambulatory Visit: Payer: Self-pay

## 2019-05-04 VITALS — Temp 97.2°F

## 2019-05-04 DIAGNOSIS — S93401D Sprain of unspecified ligament of right ankle, subsequent encounter: Secondary | ICD-10-CM

## 2019-05-06 NOTE — Progress Notes (Signed)
Subjective: 23 year old female presents the office today for evaluation of right ankle sprain.  Overall she is doing much better she returned to work.  She still doing physical therapy.  She has no new concerns. Denies any systemic complaints such as fevers, chills, nausea, vomiting. No acute changes since last appointment, and no other complaints at this time.   Objective: AAO x3, NAD DP/PT pulses palpable bilaterally, CRT less than 3 seconds On today's exam I am not able to elicit any area tenderness there is no pain on the course the lateral ankle ligaments.  Anterior drawer, talar tilt test is negative.  No edema there is no erythema or warmth. No open lesions or pre-ulcerative lesions.  No pain with calf compression, swelling, warmth, erythema  Assessment: Right ankle sprain, improving  Plan: -All treatment options discussed with the patient including all alternatives, risks, complications.  -Overall she is doing much better.  Recommend physical therapy.  She is back to work not having any issues.  Aspect.  Following questions concerns. -Patient encouraged to call the office with any questions, concerns, change in symptoms.   Trula Slade DPM

## 2020-12-08 ENCOUNTER — Encounter: Payer: 59 | Attending: Physician Assistant | Admitting: Dietician

## 2020-12-08 ENCOUNTER — Other Ambulatory Visit: Payer: Self-pay

## 2020-12-08 ENCOUNTER — Encounter: Payer: Self-pay | Admitting: Dietician

## 2020-12-08 DIAGNOSIS — Z6841 Body Mass Index (BMI) 40.0 and over, adult: Secondary | ICD-10-CM | POA: Diagnosis not present

## 2020-12-08 DIAGNOSIS — E8881 Metabolic syndrome: Secondary | ICD-10-CM | POA: Diagnosis not present

## 2020-12-08 DIAGNOSIS — E119 Type 2 diabetes mellitus without complications: Secondary | ICD-10-CM

## 2020-12-08 NOTE — Progress Notes (Signed)
Patient was seen on 12/08/2020 for the first of a series of three diabetes self-management courses at [facility].  Patient Education Plan per assessed needs and concerns is to attend three course education program for Diabetes Self Management Education.  The following learning objectives were met by the patient during this class:  Describe diabetes, types of diabetes and pathophysiology  State some common risk factors for diabetes  Defines the role of glucose and insulin  Describe the relationship between diabetes and cardiovascular and other risks  State the members of the Healthcare Team  States the rationale for glucose monitoring and when to test  State their individual [place] the importance of logging glucose readings and how to interpret the readings  Identifies A1C target  Explain the correlation between A1c and eAG values  State symptoms and treatment of high blood glucose and low blood glucose  Explain proper technique for glucose testing and identify proper sharps disposal  Handouts given during class include:  How to Thrive:  A Guide for Your Journey with Diabetes by the ADA  Meal Plan Card and carbohydrate content list  Dietary intake form  Low Sodium Flavoring Tips  Types of Fats  Dining Out  Label reading  Snack list  Planning a balanced meal  The diabetes portion plate  Diabetes Resources  A1c to eAG Conversion Chart  Blood Glucose Log  Diabetes Recommended Care Schedule  Support Group  Diabetes Success Plan  Core Class Satisfaction Survey   Follow-Up Plan:  Attend core 2

## 2020-12-15 ENCOUNTER — Other Ambulatory Visit: Payer: Self-pay

## 2020-12-15 ENCOUNTER — Encounter: Payer: Self-pay | Admitting: Dietician

## 2020-12-15 ENCOUNTER — Encounter: Payer: 59 | Admitting: Dietician

## 2020-12-15 DIAGNOSIS — E119 Type 2 diabetes mellitus without complications: Secondary | ICD-10-CM

## 2020-12-15 DIAGNOSIS — E8881 Metabolic syndrome: Secondary | ICD-10-CM | POA: Diagnosis not present

## 2020-12-15 NOTE — Progress Notes (Signed)
Patient was seen on 12/15/2020 for the second of a series of three diabetes self-management courses at the [facility]. The following learning objectives were met by the patient during this class:   Describe the role of different macronutrients on glucose  Explain how carbohydrates affect blood glucose  State what foods contain the most carbohydrates  Demonstrate carbohydrate counting  Demonstrate how to read Nutrition Facts food label  Describe effects of various fats on heart health  Describe the importance of good nutrition for health and healthy eating strategies  Describe techniques for managing your shopping, cooking and meal planning  List strategies to follow meal plan when dining out  Describe the effects of alcohol on glucose and how to use it safely  Goals:  Follow Diabetes Meal Plan as instructed  Aim to spread carbs evenly throughout the day  Aim for 3 meals per day and snacks as needed Include lean protein foods to meals/snacks  Monitor glucose levels as instructed by your doctor   Follow-Up Plan:  Attend Core 3  Work towards following your personal food plan.   

## 2020-12-22 ENCOUNTER — Encounter: Payer: 59 | Admitting: Dietician

## 2020-12-22 ENCOUNTER — Encounter: Payer: Self-pay | Admitting: Dietician

## 2020-12-22 ENCOUNTER — Other Ambulatory Visit: Payer: Self-pay

## 2020-12-22 DIAGNOSIS — E8881 Metabolic syndrome: Secondary | ICD-10-CM | POA: Diagnosis not present

## 2020-12-22 DIAGNOSIS — E119 Type 2 diabetes mellitus without complications: Secondary | ICD-10-CM

## 2020-12-22 NOTE — Progress Notes (Signed)
Patient was seen on 12/22/2020 for the third of a series of three diabetes self-management courses at the [facility].   Angela Tate the amount of activity recommended for healthy living . Describe activities suitable for individual needs . Identify ways to regularly incorporate activity into daily life . Identify barriers to activity and ways to over come these barriers  Identify diabetes medications being personally used and their primary action for lowering glucose and possible side effects . Describe role of stress on blood glucose and develop strategies to address psychosocial issues . Identify diabetes complications and ways to prevent them  Explain how to manage diabetes during illness . Evaluate success in meeting personal goal . Establish 2-3 goals that they will plan to diligently work on  Goals:   I will count my carb choices at most meals and snacks  I will be active 30 minutes or more 5 times a week, or start small (10 minutes at a time)  I will take my diabetes medications as scheduled  I will eat less unhealthy fats by eating less sugary and fatty foods, eat more non starchy vegetables   Your patient has identified these potential barriers to change:  Motivation   Your patient has identified their diabetes self-care support plan as  Family Support    Plan:  Attend Support Group as desired

## 2021-03-01 ENCOUNTER — Ambulatory Visit: Payer: 59 | Admitting: Dietician

## 2022-01-01 ENCOUNTER — Other Ambulatory Visit: Payer: Self-pay | Admitting: Physician Assistant

## 2022-01-01 DIAGNOSIS — R221 Localized swelling, mass and lump, neck: Secondary | ICD-10-CM

## 2022-01-02 ENCOUNTER — Ambulatory Visit
Admission: RE | Admit: 2022-01-02 | Discharge: 2022-01-02 | Disposition: A | Discharge status: Home / Self Care | Payer: 59 | Source: Ambulatory Visit | Attending: Physician Assistant | Admitting: Physician Assistant

## 2022-01-02 DIAGNOSIS — R221 Localized swelling, mass and lump, neck: Secondary | ICD-10-CM

## 2022-01-03 ENCOUNTER — Other Ambulatory Visit: Payer: Self-pay | Admitting: Physician Assistant

## 2022-01-03 DIAGNOSIS — R221 Localized swelling, mass and lump, neck: Secondary | ICD-10-CM

## 2022-02-05 ENCOUNTER — Ambulatory Visit
Admission: RE | Admit: 2022-02-05 | Discharge: 2022-02-05 | Disposition: A | Discharge status: Home / Self Care | Payer: 59 | Source: Ambulatory Visit | Attending: Physician Assistant | Admitting: Physician Assistant

## 2022-02-05 DIAGNOSIS — R221 Localized swelling, mass and lump, neck: Secondary | ICD-10-CM

## 2022-02-05 MED ORDER — IOPAMIDOL (ISOVUE-300) INJECTION 61%
80.0000 mL | Freq: Once | INTRAVENOUS | Status: AC | PRN
  Administered 2022-02-05: 80 mL via INTRAVENOUS

## 2022-11-01 ENCOUNTER — Ambulatory Visit (HOSPITAL_COMMUNITY): Payer: 59

## 2022-12-15 IMAGING — CT CT NECK W/ CM
3 of 6 series · 11 of 33 positions shown, 13 images · IV contrast (agent unspecified)
Comparison: Correlation made with prior ultrasound

CLINICAL DATA: Neck mass; abnormal ultrasound

EXAM:
CT NECK WITH CONTRAST
TECHNIQUE: Multidetector CT imaging of the neck was performed using the
standard protocol following the bolus administration of intravenous
contrast.

[Series 2: neck 2.00 br40 s3 st/ no angle · axial · 0.53mm/px · z∈[-754,-654]mm · 3 of 101 slices shown, 4 images]
[im 26/101  soft-tissue]
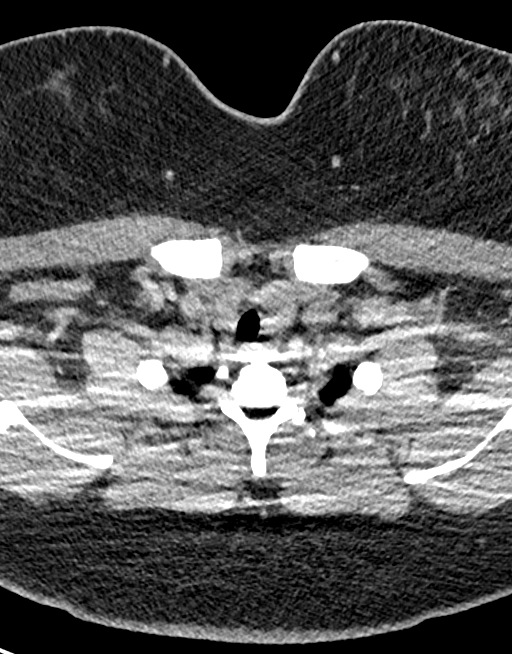
[im 26/101  bone]
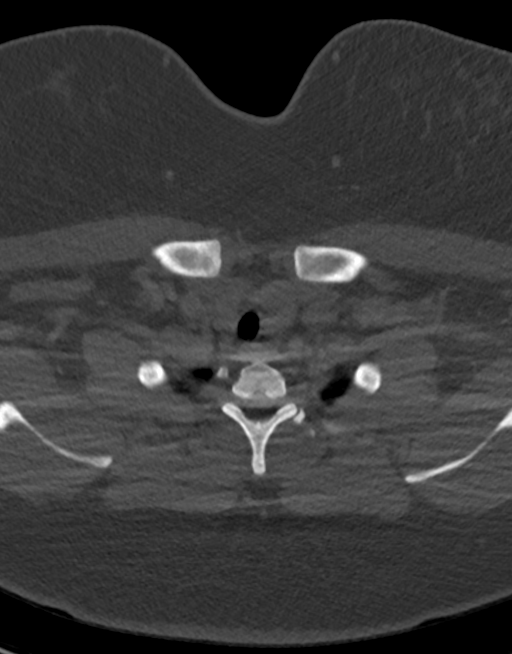
[im 51/101  bone]
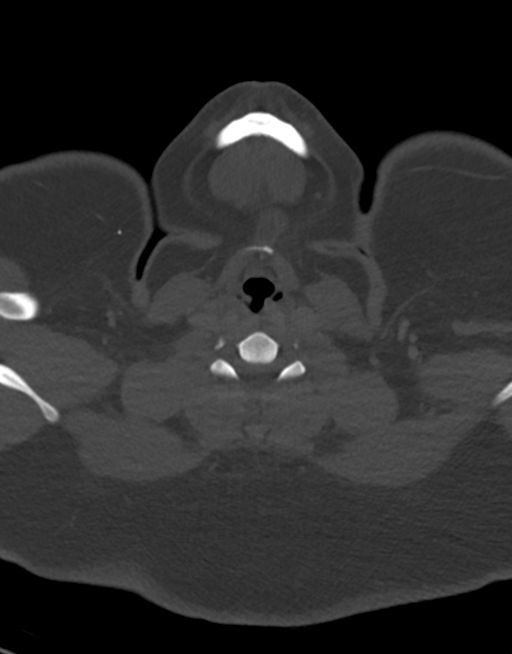
[im 76/101  bone]
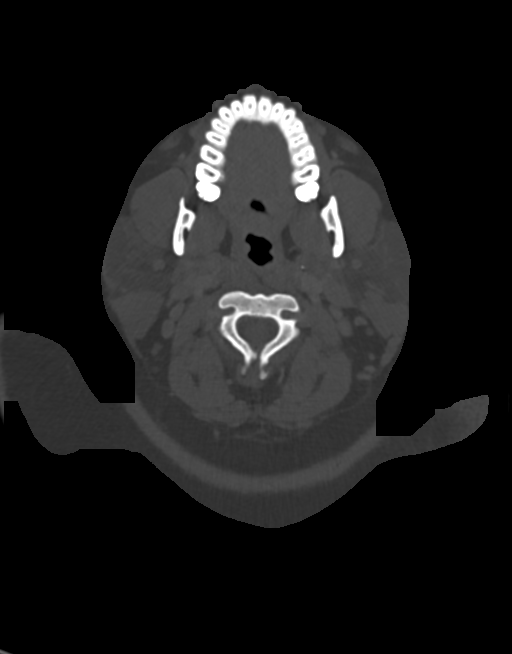

[Series 10: neck 2.00 br40 s3 (person_name) · coronal · 0.40mm/px · 3 of 186 slices shown (1 of 2)]
[im 38/186  bone]
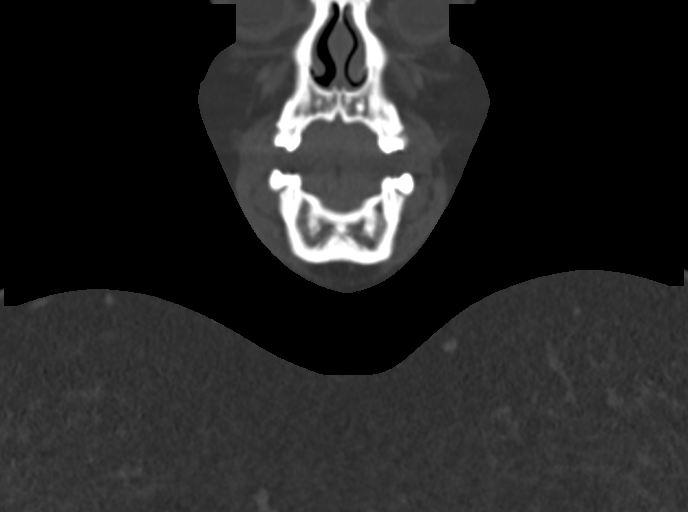
[im 75/186  bone]
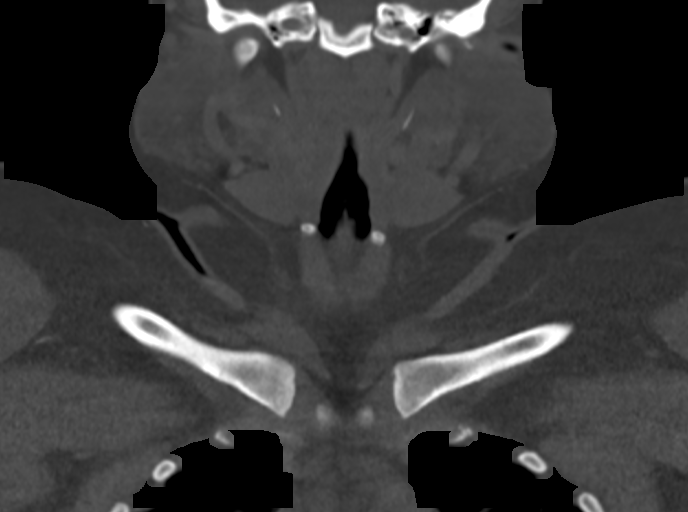
[im 112/186  bone]
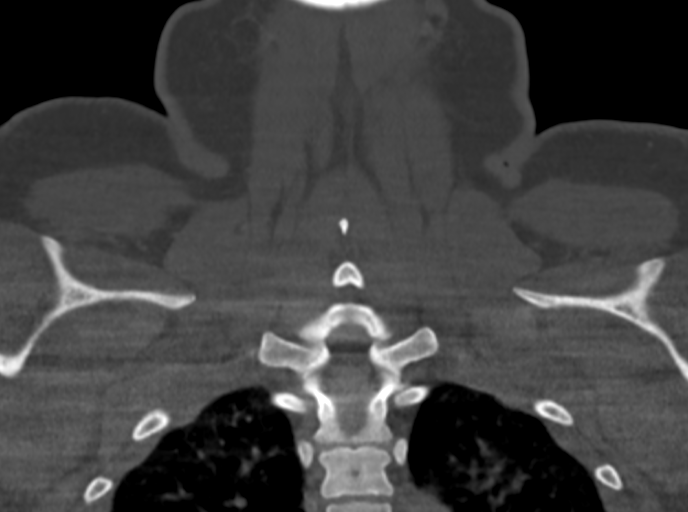

[Series 12: neck 2.00 br40 s3 (person_name) · sagittal · 0.40mm/px · 5 of 136 slices shown, 6 images (2 of 2)]
[im 46/136  bone]
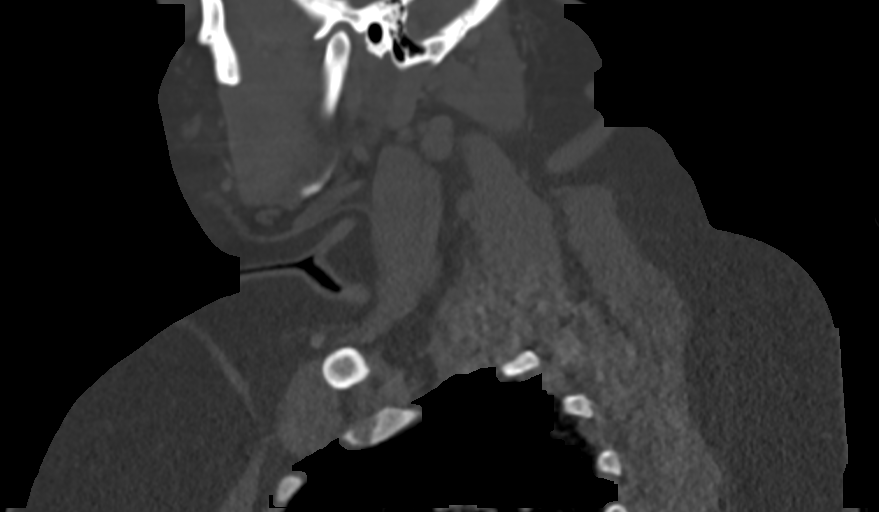
[im 57/136  bone]
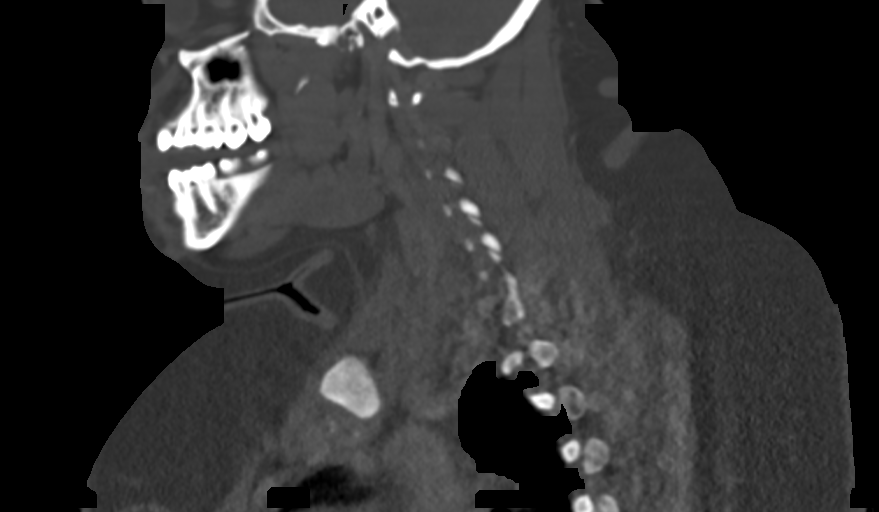
[im 68/136  soft-tissue]
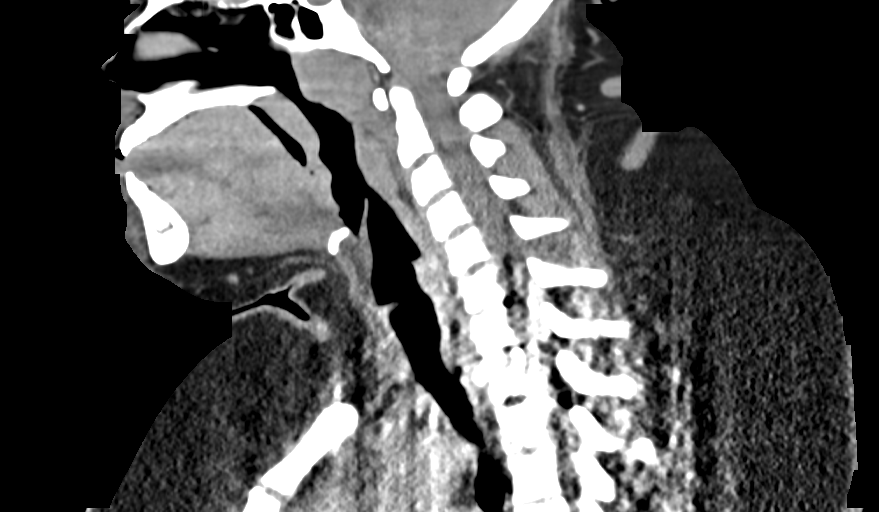
[im 68/136  bone]
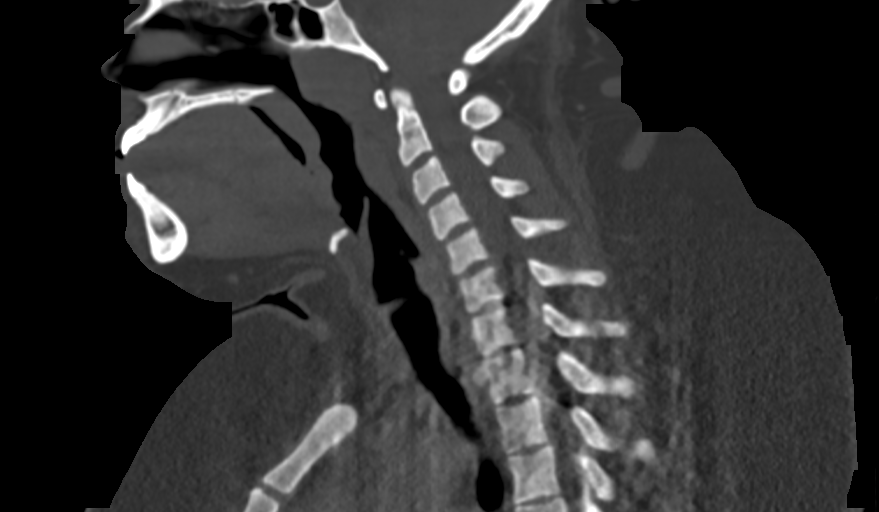
[im 79/136  bone]
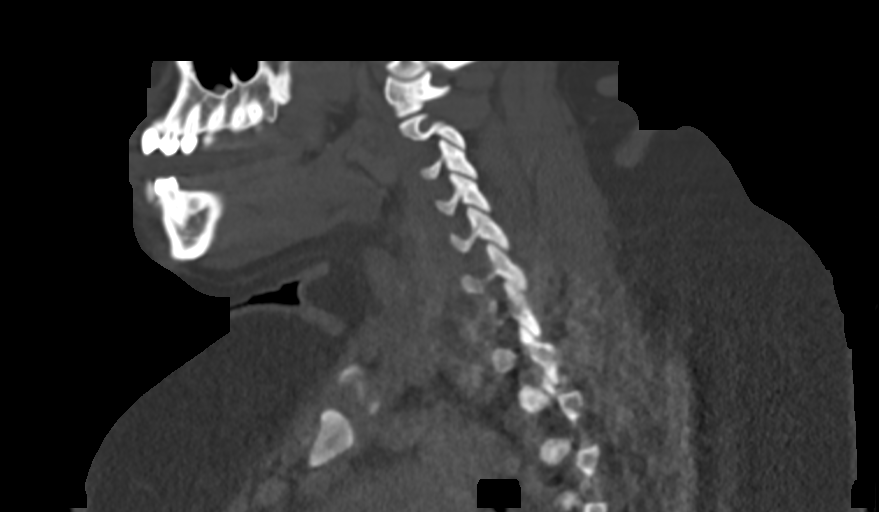
[im 91/136  bone]
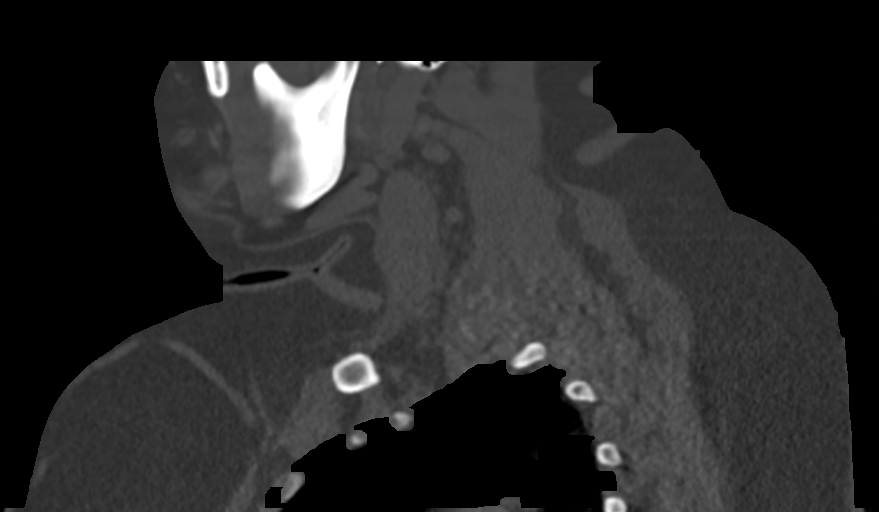

[11 of 33 positions shown; findings below may reference images not displayed]

RADIATION DOSE REDUCTION: This exam was performed according to the
departmental dose-optimization program which includes automated
exposure control, adjustment of the mA and/or kV according to
patient size and/or use of iterative reconstruction technique.

CONTRAST:  80mL XQVK32-WVV IOPAMIDOL (XQVK32-WVV) INJECTION 61%
FINDINGS: Streak artifact due to body habitus.

Pharynx and larynx: Prominent soft tissue at the posterior
nasopharyngeal wall may reflect lymphoid hyperplasia. Otherwise
unremarkable.

Salivary glands: Parotid and submandibular glands are unremarkable.

Thyroid: Poorly evaluated due to streak artifact.

Lymph nodes: No enlarged or abnormal density nodes.

Vascular: Suboptimal vessel enhancement.  No abnormality identified.

Limited intracranial: Unremarkable.

Visualized orbits: Unremarkable.

Mastoids and visualized paranasal sinuses: Included mastoid air
cells are clear. Paranasal sinus mucosal thickening.

Skeleton: No significant abnormality.

Upper chest: Included upper lungs are clear.

Other: Ventral to the hyoid bone, there is a midline soft tissue
density lesion measuring about 19 x 15 x 9 mm. This underlies the
skin marker and likely corresponds to abnormality on ultrasound.
IMPRESSION: 1.9 cm lesion ventral to the hyoid bone. This most likely represents
a thyroglossal duct cyst with increased density reflecting prior
inflammation. Suggest clinical follow-up since palpable.

Enlargement of the adenoids.

## 2023-08-21 ENCOUNTER — Ambulatory Visit
Admission: EM | Admit: 2023-08-21 | Discharge: 2023-08-21 | Disposition: A | Discharge status: Home / Self Care | Payer: 59 | Attending: Internal Medicine | Admitting: Internal Medicine

## 2023-08-21 DIAGNOSIS — M25571 Pain in right ankle and joints of right foot: Secondary | ICD-10-CM

## 2023-08-21 DIAGNOSIS — S93401A Sprain of unspecified ligament of right ankle, initial encounter: Secondary | ICD-10-CM | POA: Diagnosis not present

## 2023-08-21 DIAGNOSIS — S93409A Sprain of unspecified ligament of unspecified ankle, initial encounter: Secondary | ICD-10-CM

## 2023-08-21 DIAGNOSIS — M25471 Effusion, right ankle: Secondary | ICD-10-CM | POA: Diagnosis not present

## 2023-08-21 HISTORY — DX: Essential (primary) hypertension: I10

## 2023-08-21 HISTORY — DX: Type 2 diabetes mellitus without complications: E11.9

## 2023-08-21 MED ORDER — NAPROXEN 500 MG PO TABS: 500.0000 mg | ORAL_TABLET | Freq: Two times a day (BID) | ORAL | 0 refills | Status: AC

## 2023-08-21 NOTE — ED Provider Notes (Signed)
[facility]  Note:  This document was prepared using Conservation officer, historic buildings and may include unintentional dictation errors.  MRN: [id number] DOB: [birth year]  Subjective:   Angela Tate is a 27 y.o. female presenting for 1 day history of a right ankle injury.  Patient stepped off of a curb and rolled her ankle.  Has since had swelling and pain.  Can bear weight but with pain.  No current facility-administered medications for this encounter.  Current Outpatient Medications:    atorvastatin (LIPITOR) 10 MG tablet, Take 10 mg by mouth daily., Disp: , Rfl:    Insulin Degludec (TRESIBA FLEXTOUCH [place]), Inject into the skin., Disp: , Rfl:    losartan (COZAAR) 50 MG tablet, Take 50 mg by mouth daily., Disp: , Rfl:    ibuprofen (ADVIL) 800 MG tablet, Take 1 tablet (800 mg total) by mouth every 8 (eight) hours as needed., Disp: 30 tablet, Rfl: 0   metFORMIN (GLUCOPHAGE XR) 750 MG 24 hr tablet, Take 2 tablets daily, Disp: 180 tablet, Rfl: 1   No Known Allergies  Past Medical History:  Diagnosis Date   Allergy    Diabetes mellitus without complication (HCC)    Hypertension    Insulin resistance    Obesity    Prediabetes    Tonsillar hypertrophy      Past Surgical History:  Procedure Laterality Date   HERNIA REPAIR     TONSILLECTOMY Bilateral 04/27/2016   Procedure: TONSILLECTOMY;  Surgeon: Christia Reading, MD;  Location: [facility] OR;  Service: ENT;  Laterality: Bilateral;  30 mins    Family History  Problem Relation Age of Onset   Obesity Mother    Asthma Father    Diabetes Maternal Grandfather    Heart disease Maternal Grandfather    Hyperlipidemia Maternal Grandfather    Hypertension Maternal Grandfather    Stroke Maternal Grandfather    Diabetes Paternal Uncle    Cancer Paternal Uncle    Alcohol abuse Neg Hx    Arthritis Neg Hx    Birth defects Neg Hx    COPD Neg Hx    Depression Neg Hx    Drug abuse Neg Hx    Early death Neg Hx    Hearing  loss Neg Hx    Kidney disease Neg Hx    Learning disabilities Neg Hx    Mental illness Neg Hx    Mental retardation Neg Hx    Miscarriages / Stillbirths Neg Hx    Vision loss Neg Hx    Varicose Veins Neg Hx     Social History   Tobacco Use   Smoking status: Never   Smokeless tobacco: Never  Vaping Use   Vaping status: Never Used  Substance Use Topics   Alcohol use: No   Drug use: No    ROS   Objective:   Vitals: BP (!) 151/86 (BP Location: Right Arm)   Pulse (!) 104   Temp 98.1 F (36.7 C) (Oral)   Resp 20   SpO2 95%   Physical Exam Constitutional:      General: She is not in acute distress.    Appearance: Normal appearance. She is well-developed. She is not ill-appearing, toxic-appearing or diaphoretic.  HENT:     Head: Normocephalic and atraumatic.     Nose: Nose normal.     Mouth/Throat:     Mouth: Mucous membranes are moist.  Eyes:     General: No scleral icterus.  Right eye: No discharge.        Left eye: No discharge.     Extraocular Movements: Extraocular movements intact.  Cardiovascular:     Rate and Rhythm: Normal rate.  Pulmonary:     Effort: Pulmonary effort is normal.  Musculoskeletal:     Right ankle: Swelling (laterally) present. No deformity or ecchymosis.  Skin:    General: Skin is warm and dry.  Neurological:     General: No focal deficit present.     Mental Status: She is alert and oriented to person, place, and time.  Psychiatric:        Mood and Affect: Mood normal.        Behavior: Behavior normal.    Patient provided with crutches.  Right ankle wrapped using 4" Ace wrap in figure-8 method.   Assessment and Plan :   PDMP not reviewed this encounter.  1. Pain and swelling of right ankle   2. Sprain of ankle, initial encounter    Patient declined x-ray since we do not have a radiology tech onsite.  Will manage for ankle sprain with rice method, NSAID. Counseled patient on potential for adverse effects with medications  prescribed/recommended today, ER and return-to-clinic precautions discussed, patient verbalized understanding.    Wallis Bamberg, PA-C 08/21/23 1159

## 2023-08-21 NOTE — ED Triage Notes (Signed)
Pt states she injured right ankle when she stepped off curb last night-NAD-limping gait

## 2023-10-15 ENCOUNTER — Inpatient Hospital Stay: Payer: 59

## 2023-10-15 ENCOUNTER — Other Ambulatory Visit: Payer: Self-pay

## 2023-10-15 ENCOUNTER — Encounter: Payer: Self-pay | Admitting: Hematology

## 2023-10-15 ENCOUNTER — Inpatient Hospital Stay: Payer: 59 | Attending: Hematology | Admitting: Hematology

## 2023-10-15 VITALS — BP 156/111 | HR 112 | Temp 97.8°F | Resp 18 | Ht 63.0 in | Wt 321.3 lb

## 2023-10-15 DIAGNOSIS — I10 Essential (primary) hypertension: Secondary | ICD-10-CM | POA: Insufficient documentation

## 2023-10-15 DIAGNOSIS — D649 Anemia, unspecified: Secondary | ICD-10-CM

## 2023-10-15 DIAGNOSIS — D509 Iron deficiency anemia, unspecified: Secondary | ICD-10-CM | POA: Diagnosis not present

## 2023-10-15 DIAGNOSIS — D75839 Thrombocytosis, unspecified: Secondary | ICD-10-CM | POA: Insufficient documentation

## 2023-10-15 DIAGNOSIS — Z1321 Encounter for screening for nutritional disorder: Secondary | ICD-10-CM

## 2023-10-15 DIAGNOSIS — E119 Type 2 diabetes mellitus without complications: Secondary | ICD-10-CM | POA: Diagnosis not present

## 2023-10-15 LAB — RETIC PANEL
Immature Retic Fract: 25.9 % — ABNORMAL HIGH (ref 2.3–15.9)
RBC.: 5.18 MIL/uL — ABNORMAL HIGH (ref 3.87–5.11)
Retic Count, Absolute: 120.7 10*3/uL (ref 19.0–186.0)
Retic Ct Pct: 2.3 % (ref 0.4–3.1)
Reticulocyte Hemoglobin: 27.4 pg — ABNORMAL LOW (ref >27.9)

## 2023-10-15 LAB — CBC WITH DIFFERENTIAL (CANCER CENTER ONLY)
Abs Immature Granulocytes: 0.13 10*3/uL — ABNORMAL HIGH (ref 0.00–0.07)
Basophils Absolute: 0.1 10*3/uL (ref 0.0–0.1)
Basophils Relative: 1 %
Eosinophils Absolute: 0.2 10*3/uL (ref 0.0–0.5)
Eosinophils Relative: 2 %
HCT: 39 % (ref 36.0–46.0)
Hemoglobin: 12.2 g/dL (ref 12.0–15.0)
Immature Granulocytes: 1 %
Lymphocytes Relative: 29 %
Lymphs Abs: 3.7 10*3/uL (ref 0.7–4.0)
MCH: 23.9 pg — ABNORMAL LOW (ref 26.0–34.0)
MCHC: 31.3 g/dL (ref 30.0–36.0)
MCV: 76.5 fL — ABNORMAL LOW (ref 80.0–100.0)
Monocytes Absolute: 0.6 10*3/uL (ref 0.1–1.0)
Monocytes Relative: 4 %
Neutro Abs: 7.9 10*3/uL — ABNORMAL HIGH (ref 1.7–7.7)
Neutrophils Relative %: 63 %
Platelet Count: 544 10*3/uL — ABNORMAL HIGH (ref 150–400)
RBC: 5.1 MIL/uL (ref 3.87–5.11)
RDW: 15.9 % — ABNORMAL HIGH (ref 11.5–15.5)
WBC Count: 12.5 10*3/uL — ABNORMAL HIGH (ref 4.0–10.5)
nRBC: 0 % (ref 0.0–0.2)

## 2023-10-15 LAB — IRON AND IRON BINDING CAPACITY (CC-WL,HP ONLY)
Iron: 30 ug/dL (ref 28–170)
Saturation Ratios: 6 % — ABNORMAL LOW (ref 10.4–31.8)
TIBC: 484 ug/dL — ABNORMAL HIGH (ref 250–450)
UIBC: 454 ug/dL

## 2023-10-15 LAB — VITAMIN D 25 HYDROXY (VIT D DEFICIENCY, FRACTURES): Vit D, 25-Hydroxy: 11.68 ng/mL — ABNORMAL LOW (ref 30–100)

## 2023-10-15 LAB — FOLATE: Folate: 6.6 ng/mL (ref >5.9)

## 2023-10-15 NOTE — Progress Notes (Signed)
[facility]   [phone] [phone]   Clinic New Consult Note   Patient Care Team: Milus Height, [name] as PCP - General (Nurse Practitioner) 10/15/2023  CHIEF COMPLAINTS/PURPOSE OF CONSULTATION:  Thrombocytosis  REFERRING PHYSICIAN: Dr. Ardean Larsen   Discussed the use of AI scribe software for clinical note transcription with the patient, who gave verbal consent to proceed.  History of Present Illness   A 28 year old individual with a history of diabetes and hypertension presents for evaluation of thrombocytosis.  She presents to clinic with her grandmother.    She has had mild thrombocytosis with platelet in 500-600 range for more than 7 years.  Her first CBC in our system in August 2017 showed hemoglobin 11.1, MCV 68.6, platelet of 581, WBC 9.6.  According to her lab work from her gynecologist, her CBC from April and September last year both showed slightly elevated platelet count in 500's, and mild anemia with hemoglobin at 11.5 and slightly elevated white blood cell count at 13.2. The patient denies any history of blood clots.  The patient has been on birth control for about six years, which has regulated her menstrual cycle. However, the duration of her menstrual period varies, lasting from a day to almost two weeks, with an average of about five days. The patient reports that her periods were heavier before starting birth control. She also noted that her last period, about a month ago, was heavier than usual, with two days of going through pads very quickly.  The patient has a significant family history of cancer, with an uncle who had non-Hodgkin's lymphoma and multiple relatives on her maternal side who had breast cancer. However, genetic testing in the family has not revealed any positive results. The patient denies any use of alcohol, tobacco, or other drugs.         MEDICAL HISTORY:  Past Medical History:  Diagnosis Date   Allergy     Diabetes mellitus without complication (HCC)    Hypertension    Insulin resistance    Obesity    Prediabetes    Tonsillar hypertrophy     SURGICAL HISTORY: Past Surgical History:  Procedure Laterality Date   HERNIA REPAIR     TONSILLECTOMY Bilateral 04/27/2016   Procedure: TONSILLECTOMY;  Surgeon: Christia Reading, MD;  Location: [facility] OR;  Service: ENT;  Laterality: Bilateral;  30 mins    SOCIAL HISTORY: Social History   Socioeconomic History   Marital status: Single    Spouse name: Not on file   Number of children: 0   Years of education: Not on file   Highest education level: Not on file  Occupational History   Not on file  Tobacco Use   Smoking status: Never   Smokeless tobacco: Never  Vaping Use   Vaping status: Never Used  Substance and Sexual Activity   Alcohol use: No   Drug use: No   Sexual activity: Yes    Birth control/protection: Implant  Other Topics Concern   Not on file  Social History Narrative   Lives with mom and brother. Dad involved sporadically. 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 grade   Social Drivers of Health   Financial Resource Strain: Not on file  Food Insecurity: No Food Insecurity (10/15/2023)   Hunger Vital Sign    Worried About Running Out of Food in the Last Year: Never true    Ran Out of  Food in the Last Year: Never true  Transportation Needs: No Transportation Needs (10/15/2023)   PRAPARE - Administrator, Civil Service (Medical): No    Lack of Transportation (Non-Medical): No  Physical Activity: Not on file  Stress: Not on file  Social Connections: Not on file  Intimate Partner Violence: Not At Risk (10/15/2023)   Humiliation, Afraid, Rape, and Kick questionnaire    Fear of Current or Ex-Partner: No    Emotionally Abused: No    Physically Abused: No    Sexually Abused: No    FAMILY HISTORY: Family History  Problem Relation Age of  Onset   Obesity Mother    Asthma Father    Diabetes Maternal Grandfather    Heart disease Maternal Grandfather    Hyperlipidemia Maternal Grandfather    Hypertension Maternal Grandfather    Stroke Maternal Grandfather    Diabetes Paternal Uncle    Cancer Paternal Uncle    Alcohol abuse Neg Hx    Arthritis Neg Hx    Birth defects Neg Hx    COPD Neg Hx    Depression Neg Hx    Drug abuse Neg Hx    Early death Neg Hx    Hearing loss Neg Hx    Kidney disease Neg Hx    Learning disabilities Neg Hx    Mental illness Neg Hx    Mental retardation Neg Hx    Miscarriages / Stillbirths Neg Hx    Vision loss Neg Hx    Varicose Veins Neg Hx     ALLERGIES:  has no known allergies.  MEDICATIONS:  Current Outpatient Medications  Medication Sig Dispense Refill   atorvastatin (LIPITOR) 10 MG tablet Take 10 mg by mouth daily.     ibuprofen (ADVIL) 800 MG tablet Take 1 tablet (800 mg total) by mouth every 8 (eight) hours as needed. 30 tablet 0   Insulin Degludec (TRESIBA FLEXTOUCH [place]) Inject into the skin.     losartan (COZAAR) 50 MG tablet Take 50 mg by mouth daily.     metFORMIN (GLUCOPHAGE XR) 750 MG 24 hr tablet Take 2 tablets daily 180 tablet 1   naproxen (NAPROSYN) 500 MG tablet Take 1 tablet (500 mg total) by mouth 2 (two) times daily with a meal. 30 tablet 0   No current facility-administered medications for this visit.    REVIEW OF SYSTEMS:   Constitutional: Denies fevers, chills or abnormal night sweats Eyes: Denies blurriness of vision, double vision or watery eyes Ears, nose, mouth, throat, and face: Denies mucositis or sore throat Respiratory: Denies cough, dyspnea or wheezes Cardiovascular: Denies palpitation, chest discomfort or lower extremity swelling Gastrointestinal:  Denies nausea, heartburn or change in bowel habits Skin: Denies abnormal skin rashes Lymphatics: Denies new lymphadenopathy or easy bruising Neurological:Denies numbness, tingling or new  weaknesses Behavioral/Psych: Mood is stable, no new changes  All other systems were reviewed with the patient and are negative.  PHYSICAL EXAMINATION: ECOG PERFORMANCE STATUS: 0 - Asymptomatic  Vitals:   10/15/23 1522  BP: (!) 156/111  Pulse: (!) 112  Resp: 18  Temp: 97.8 F (36.6 C)  SpO2: 100%   Filed Weights   10/15/23 1522  Weight: (!) 321 lb 4.8 oz (145.7 kg)    GENERAL:alert, no distress and comfortable SKIN: skin color, texture, turgor are normal, no rashes or significant lesions EYES: normal, conjunctiva are pink and non-injected, sclera clear OROPHARYNX:no exudate, no erythema and lips, buccal mucosa, and tongue normal  NECK: supple, thyroid normal  size, non-tender, without nodularity LYMPH:  no palpable lymphadenopathy in the cervical, axillary or inguinal LUNGS: clear to auscultation and percussion with normal breathing effort HEART: regular rate & rhythm and no murmurs and no lower extremity edema ABDOMEN:abdomen soft, non-tender and normal bowel sounds Musculoskeletal:no cyanosis of digits and no clubbing  PSYCH: alert & oriented x 3 with fluent speech NEURO: no focal motor/sensory deficits  Physical Exam   ABDOMEN: No tenderness on palpation.      LABORATORY DATA:  I have reviewed the data as listed    Latest Ref Rng & Units 10/15/2023    4:16 PM 04/24/2016    9:13 AM 10/28/2014    3:30 PM  CBC  WBC 4.0 - 10.5 K/uL 12.5  9.6    Hemoglobin 12.0 - 15.0 g/dL 82.9  56.2  13.0   Hematocrit 36.0 - 46.0 % 39.0  34.7    Platelets 150 - 400 K/uL 544  581        Latest Ref Rng & Units 04/24/2016   12:01 AM 03/09/2014   10:19 AM 09/22/2012    4:28 PM  CMP  Glucose 70 - 99 mg/dL 81  82  78   BUN 7 - 20 mg/dL 10  10  11    Creatinine 0.50 - 1.00 mg/dL 8.65  7.84  6.96   Sodium 135 - 146 mmol/L 138  138  137   Potassium 3.8 - 5.1 mmol/L 4.6  4.4  4.5   Chloride 98 - 110 mmol/L 103  104  104   CO2 20 - 31 mmol/L 24  23  27    Calcium 8.9 - 10.4 mg/dL 9.4  9.6  9.5    Total Protein 6.3 - 8.2 g/dL 7.0  6.8  6.9   Total Bilirubin 0.2 - 1.1 mg/dL 0.2  0.2  0.2   Alkaline Phos 47 - 176 U/L 65  66  64   AST 12 - 32 U/L 13  13  11    ALT 5 - 32 U/L 12  8  8       RADIOGRAPHIC STUDIES: I have personally reviewed the radiological images as listed and agreed with the findings in the report. No results found.  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 Differential includes reactive thrombocytosis due to iron deficiency anemia and chronic inflammation versus essential thrombocythemia. Given age and lack of other symptoms, reactive thrombocytosis is more likely. - Order CBC, iron studies, vitamin B12, and vitamin D levels - Recommend iron supplementation if deficiency is confirmed - Advise taking iron supplements with vitamin C or orange juice to enhance absorption - Discuss potential gastrointestinal side effects of iron supplements, including constipation and stomach upset  Iron Deficiency Anemia Mild anemia with hemoglobin around 11 g/dL, likely secondary to menstrual blood loss. Iron deficiency is suspected as the primary cause. - Order iron studies - Recommend iron supplementation if deficiency is confirmed - Advise taking iron supplements with vitamin C or orange juice to enhance absorption - Discuss potential gastrointestinal side effects of iron supplements, including constipation and stomach upset  Diabetes Mellitus Known diabetes mellitus. No specific discussion of current management or control during this visit.  Hypertension Known hypertension. No specific discussion of current management or control during this visit.  General Health Maintenance Discussed importance of regular health maintenance,  including cancer screenings  and lifestyle modifications. Emphasized regular exercise and dietary modifications to manage weight, aiding diabetes and hypertension control. Discussed starting screening mammograms by age 28 due to family history of breast cancer. - Recommend starting screening mammograms by age 41 due to family history of breast cancer - Encourage regular exercise and dietary modifications to manage weight, aiding diabetes and hypertension control  Plan -lab toay  - Schedule phone follow-up appointment to review lab results and discuss further management.         Orders Placed This Encounter  Procedures   CBC with Differential (Cancer Center Only)    Standing Status:   Future    Number of Occurrences:   1    Expected Date:   10/15/2023    Expiration Date:   10/14/2024   Ferritin    Standing Status:   Future    Number of Occurrences:   1    Expiration Date:   10/14/2024   Vitamin D 25 hydroxy    Standing Status:   Future    Number of Occurrences:   1    Expected Date:   10/15/2023    Expiration Date:   10/14/2024   Retic Panel    Standing Status:   Future    Number of Occurrences:   1    Expected Date:   10/15/2023    Expiration Date:   10/14/2024   Folate, Serum    Standing Status:   Future    Number of Occurrences:   1    Expected Date:   10/15/2023    Expiration Date:   10/14/2024    All questions were answered. The patient knows to call the clinic with any problems, questions or concerns. I spent 25 minutes counseling the patient face to face. The total time spent in the appointment was 30 minutes and more than 50% was on counseling.     Malachy Mood, MD 10/15/2023

## 2023-10-16 LAB — FERRITIN: Ferritin: 60 ng/mL (ref 11–307)

## 2023-10-17 ENCOUNTER — Telehealth: Payer: Self-pay | Admitting: Nurse Practitioner

## 2023-10-17 NOTE — Telephone Encounter (Signed)
 Left patient a voicemail in regards to scheduled appointment times/dates; left callback number if needed for scheduling

## 2023-11-08 ENCOUNTER — Institutional Professional Consult (permissible substitution) (INDEPENDENT_AMBULATORY_CARE_PROVIDER_SITE_OTHER): Payer: 59 | Admitting: Otolaryngology

## 2023-12-16 ENCOUNTER — Telehealth: Payer: Self-pay | Admitting: Otolaryngology

## 2023-12-16 NOTE — Telephone Encounter (Signed)
 Called 12-16-23, left a VM, Dr. will not be in office

## 2023-12-17 ENCOUNTER — Telehealth: Payer: Self-pay | Admitting: Nurse Practitioner

## 2023-12-17 NOTE — Telephone Encounter (Signed)
 Rescheduled appointments per patients request via incoming call. Talked with the patient and she is aware of the changes made to her upcoming appointments.

## 2024-01-10 ENCOUNTER — Institutional Professional Consult (permissible substitution) (INDEPENDENT_AMBULATORY_CARE_PROVIDER_SITE_OTHER): Payer: 59 | Admitting: Otolaryngology

## 2024-01-16 ENCOUNTER — Ambulatory Visit: Payer: 59 | Admitting: Nurse Practitioner

## 2024-01-16 ENCOUNTER — Other Ambulatory Visit: Payer: 59

## 2024-01-17 ENCOUNTER — Ambulatory Visit
Admission: RE | Admit: 2024-01-17 | Discharge: 2024-01-17 | Disposition: A | Discharge status: Home / Self Care | Source: Ambulatory Visit | Attending: Family Medicine | Admitting: Family Medicine

## 2024-01-17 VITALS — BP 134/89 | HR 109 | Temp 98.0°F | Resp 20

## 2024-01-17 DIAGNOSIS — N760 Acute vaginitis: Secondary | ICD-10-CM | POA: Diagnosis present

## 2024-01-17 DIAGNOSIS — N3001 Acute cystitis with hematuria: Secondary | ICD-10-CM | POA: Insufficient documentation

## 2024-01-17 LAB — POCT URINALYSIS DIP (MANUAL ENTRY)
Bilirubin, UA: NEGATIVE
Glucose, UA: 100 mg/dL — AB
Leukocytes, UA: NEGATIVE
Nitrite, UA: NEGATIVE
Spec Grav, UA: 1.02
Urobilinogen, UA: 2 U/dL — AB
pH, UA: 7

## 2024-01-17 LAB — POCT URINE PREGNANCY: Preg Test, Ur: NEGATIVE

## 2024-01-17 MED ORDER — FLUCONAZOLE 150 MG PO TABS: 150.0000 mg | ORAL_TABLET | ORAL | 0 refills | Status: AC

## 2024-01-17 MED ORDER — CEPHALEXIN 500 MG PO CAPS: 500.0000 mg | ORAL_CAPSULE | Freq: Two times a day (BID) | ORAL | 0 refills | Status: AC

## 2024-01-17 MED ORDER — FLUCONAZOLE 150 MG PO TABS: 150.0000 mg | ORAL_TABLET | ORAL | 0 refills | Status: DC

## 2024-01-17 NOTE — ED Triage Notes (Signed)
 Pt c/o of vaginal itching, sore pain with urinating since Monday.No home remedies.

## 2024-01-17 NOTE — ED Provider Notes (Signed)
 [facility]  Note:  This document was prepared using Conservation officer, historic buildings and may include unintentional dictation errors.  MRN: [id number] DOB: 1996-02-11  Subjective:   Angela Tate is a 28 y.o. female presenting for 5-day history of persistent vaginal itching, vaginal irritation and discoloration of the vaginal area.  Patient is prone to yeast infections from her diabetes.  She is treated with insulin .  However, patient is also having dysuria.  Uses birth control but would like to check for pregnancy.  She is not opposed to STI check either.   No current facility-administered medications for this encounter.  Current Outpatient Medications:    atorvastatin (LIPITOR) 10 MG tablet, Take 10 mg by mouth daily., Disp: , Rfl:    losartan (COZAAR) 50 MG tablet, Take 50 mg by mouth daily., Disp: , Rfl:    ibuprofen  (ADVIL ) 800 MG tablet, Take 1 tablet (800 mg total) by mouth every 8 (eight) hours as needed. (Patient not taking: Reported on 01/17/2024), Disp: 30 tablet, Rfl: 0   Insulin  Degludec (TRESIBA FLEXTOUCH [place]), Inject into the skin., Disp: , Rfl:    metFORMIN  (GLUCOPHAGE  XR) 750 MG 24 hr tablet, Take 2 tablets daily (Patient not taking: Reported on 01/17/2024), Disp: 180 tablet, Rfl: 1   naproxen  (NAPROSYN ) 500 MG tablet, Take 1 tablet (500 mg total) by mouth 2 (two) times daily with a meal. (Patient not taking: Reported on 01/17/2024), Disp: 30 tablet, Rfl: 0   No Known Allergies  Past Medical History:  Diagnosis Date   Allergy    Diabetes mellitus without complication (HCC)    Hypertension    Insulin  resistance    Obesity    Prediabetes    Tonsillar hypertrophy      Past Surgical History:  Procedure Laterality Date   HERNIA REPAIR     TONSILLECTOMY Bilateral 04/27/2016   Procedure: TONSILLECTOMY;  Surgeon: Virgina Grills, MD;  Location: [facility] OR;  Service: ENT;  Laterality: Bilateral;  30 mins    Family History  Problem Relation Age of Onset    Obesity Mother    Asthma Father    Diabetes Maternal Grandfather    Heart disease Maternal Grandfather    Hyperlipidemia Maternal Grandfather    Hypertension Maternal Grandfather    Stroke Maternal Grandfather    Diabetes Paternal Uncle    Cancer Paternal Uncle    Alcohol abuse Neg Hx    Arthritis Neg Hx    Birth defects Neg Hx    COPD Neg Hx    Depression Neg Hx    Drug abuse Neg Hx    Early death Neg Hx    Hearing loss Neg Hx    Kidney disease Neg Hx    Learning disabilities Neg Hx    Mental illness Neg Hx    Mental retardation Neg Hx    Miscarriages / Stillbirths Neg Hx    Vision loss Neg Hx    Varicose Veins Neg Hx     Social History   Tobacco Use   Smoking status: Never   Smokeless tobacco: Never  Vaping Use   Vaping status: Never Used  Substance Use Topics   Alcohol use: No   Drug use: No    ROS   Objective:   Vitals: BP 134/89 (BP Location: Right Arm)   Pulse (!) 109   Temp 98 F (36.7 C) (Oral)   Resp 20   LMP 12/19/2023 (Approximate)   SpO2 97%   Physical Exam Constitutional:  General: She is not in acute distress.    Appearance: Normal appearance. She is well-developed. She is not ill-appearing, toxic-appearing or diaphoretic.  HENT:     Head: Normocephalic and atraumatic.     Nose: Nose normal.     Mouth/Throat:     Mouth: Mucous membranes are moist.     Pharynx: Oropharynx is clear.  Eyes:     General: No scleral icterus.       Right eye: No discharge.        Left eye: No discharge.     Extraocular Movements: Extraocular movements intact.     Conjunctiva/sclera: Conjunctivae normal.  Cardiovascular:     Rate and Rhythm: Normal rate.  Pulmonary:     Effort: Pulmonary effort is normal.  Abdominal:     General: Bowel sounds are normal. There is no distension.     Palpations: Abdomen is soft. There is no mass.     Tenderness: There is no abdominal tenderness. There is no right CVA tenderness, left CVA tenderness, guarding or  rebound.  Genitourinary:   Skin:    General: Skin is warm and dry.  Neurological:     General: No focal deficit present.     Mental Status: She is alert and oriented to person, place, and time.  Psychiatric:        Mood and Affect: Mood normal.        Behavior: Behavior normal.        Thought Content: Thought content normal.        Judgment: Judgment normal.    Results for orders placed or performed during the hospital encounter of 01/17/24 (from the past 24 hours)  POCT urine pregnancy     Status: Normal   Collection Time: 01/17/24  3:47 PM  Result Value Ref Range   Preg Test, Ur Negative   POCT urinalysis dipstick     Status: Abnormal   Collection Time: 01/17/24  3:48 PM  Result Value Ref Range   Color, UA light yellow (A)    Clarity, UA hazy (A)    Glucose, UA =100 (A) mg/dL   Bilirubin, UA negative    Ketones, POC UA trace (5) (A) mg/dL   Spec Grav, UA 4.098    Blood, UA trace-lysed (A)    pH, UA 7.0    Protein Ur, POC trace (A) mg/dL   Urobilinogen, UA 2.0 (A) E.U./dL   Nitrite, UA Negative    Leukocytes, UA Negative     Assessment and Plan :   PDMP not reviewed this encounter.  1. Acute cystitis with hematuria   2. Acute vaginitis    Start cephalexin to cover for acute cystitis, urine culture pending.  Will also cover for acute vaginitis with fluconazole , vaginal cytology pending.  Recommended aggressive hydration, limiting urinary irritants. Counseled patient on potential for adverse effects with medications prescribed/recommended today, ER and return-to-clinic precautions discussed, patient verbalized understanding.    Adolph Hoop, [place] 01/17/24 1554

## 2024-01-17 NOTE — Discharge Instructions (Addendum)
 Please start cephalexin to address an urinary tract infection and fluconazole  for acute vaginitis. Make sure you hydrate very well with plain water and a quantity of 80 ounces of water a day.  Please limit drinks that are considered urinary irritants such as soda, sweet tea, coffee, energy drinks, alcohol.  These can worsen your urinary and genital symptoms but also be the source of them.  I will let you know about your urine culture and vaginal swab results through MyChart to see if we need to prescribe or change your antibiotics based off of those results.

## 2024-01-20 LAB — CERVICOVAGINAL ANCILLARY ONLY
Bacterial Vaginitis (gardnerella): POSITIVE — AB
Candida Glabrata: NEGATIVE
Candida Vaginitis: POSITIVE — AB
Chlamydia: NEGATIVE
Comment: NEGATIVE
Comment: NEGATIVE
Comment: NEGATIVE
Comment: NEGATIVE
Comment: NEGATIVE
Comment: NORMAL
Neisseria Gonorrhea: NEGATIVE
Trichomonas: NEGATIVE

## 2024-01-21 ENCOUNTER — Telehealth: Payer: Self-pay

## 2024-01-21 MED ORDER — METRONIDAZOLE 500 MG PO TABS: 500.0000 mg | ORAL_TABLET | Freq: Two times a day (BID) | ORAL | 0 refills | Status: DC

## 2024-01-21 MED ORDER — METRONIDAZOLE 0.75 % VA GEL: 1.0000 | Freq: Every day | VAGINAL | 0 refills | Status: AC

## 2024-01-21 NOTE — Addendum Note (Signed)
 Addended by: Sharilyn Davenport on: 01/21/2024 01:50 PM   Modules accepted: Orders

## 2024-01-21 NOTE — Telephone Encounter (Signed)
 Per protocol, pt requires tx with metronidazole. Rx sent to pharmacy on file.

## 2024-01-21 NOTE — Telephone Encounter (Signed)
 Pt requested gel instead of PO tx.

## 2024-03-13 ENCOUNTER — Other Ambulatory Visit: Payer: Self-pay

## 2024-03-13 DIAGNOSIS — D649 Anemia, unspecified: Secondary | ICD-10-CM

## 2024-03-16 ENCOUNTER — Inpatient Hospital Stay (HOSPITAL_BASED_OUTPATIENT_CLINIC_OR_DEPARTMENT_OTHER): Admitting: Hematology

## 2024-03-16 ENCOUNTER — Inpatient Hospital Stay: Attending: Hematology

## 2024-03-16 VITALS — BP 132/88 | HR 103 | Temp 97.9°F | Resp 20 | Ht 63.0 in | Wt 327.1 lb

## 2024-03-16 DIAGNOSIS — E559 Vitamin D deficiency, unspecified: Secondary | ICD-10-CM | POA: Diagnosis not present

## 2024-03-16 DIAGNOSIS — D75839 Thrombocytosis, unspecified: Secondary | ICD-10-CM | POA: Insufficient documentation

## 2024-03-16 DIAGNOSIS — D509 Iron deficiency anemia, unspecified: Secondary | ICD-10-CM | POA: Insufficient documentation

## 2024-03-16 DIAGNOSIS — N920 Excessive and frequent menstruation with regular cycle: Secondary | ICD-10-CM | POA: Insufficient documentation

## 2024-03-16 DIAGNOSIS — D649 Anemia, unspecified: Secondary | ICD-10-CM

## 2024-03-16 LAB — CBC WITH DIFFERENTIAL (CANCER CENTER ONLY)
Abs Immature Granulocytes: 0.15 10*3/uL — ABNORMAL HIGH (ref 0.00–0.07)
Basophils Absolute: 0 10*3/uL (ref 0.0–0.1)
Basophils Relative: 0 %
Eosinophils Absolute: 0.1 10*3/uL (ref 0.0–0.5)
Eosinophils Relative: 1 %
HCT: 37 % (ref 36.0–46.0)
Hemoglobin: 12 g/dL (ref 12.0–15.0)
Immature Granulocytes: 2 %
Lymphocytes Relative: 30 %
Lymphs Abs: 3.1 10*3/uL (ref 0.7–4.0)
MCH: 24.1 pg — ABNORMAL LOW (ref 26.0–34.0)
MCHC: 32.4 g/dL (ref 30.0–36.0)
MCV: 74.4 fL — ABNORMAL LOW (ref 80.0–100.0)
Monocytes Absolute: 0.5 10*3/uL (ref 0.1–1.0)
Monocytes Relative: 5 %
Neutro Abs: 6.2 10*3/uL (ref 1.7–7.7)
Neutrophils Relative %: 62 %
Platelet Count: 494 10*3/uL — ABNORMAL HIGH (ref 150–400)
RBC: 4.97 MIL/uL (ref 3.87–5.11)
RDW: 15.4 % (ref 11.5–15.5)
WBC Count: 10.1 10*3/uL (ref 4.0–10.5)
nRBC: 0 % (ref 0.0–0.2)

## 2024-03-16 LAB — IRON AND IRON BINDING CAPACITY (CC-WL,HP ONLY)
Iron: 42 ug/dL (ref 28–170)
Saturation Ratios: 9 % — ABNORMAL LOW (ref 10.4–31.8)
TIBC: 461 ug/dL — ABNORMAL HIGH (ref 250–450)
UIBC: 419 ug/dL (ref 148–442)

## 2024-03-16 LAB — FERRITIN: Ferritin: 74 ng/mL (ref 11–307)

## 2024-03-16 LAB — VITAMIN D 25 HYDROXY (VIT D DEFICIENCY, FRACTURES): Vit D, 25-Hydroxy: 17.79 ng/mL — ABNORMAL LOW (ref 30–100)

## 2024-03-16 NOTE — Progress Notes (Signed)
 [facility]   [phone] [phone]   Clinic Follow up Note   Patient Care Team: [name], [name] as PCP - General (Nurse Practitioner)  Date of Service:  03/16/2024  CHIEF COMPLAINT: f/u of thrombocytosis  CURRENT THERAPY:  Oral iron  Oncology History   Thrombocytosis - Likely reactive due to iron deficient anemia -I recommend over-the-counter oral iron -Will monitor her CBC.  Assessment & Plan Iron deficiency anemia Iron deficiency anemia with low MCV, likely secondary to menstrual bleeding. She experiences occasional menorrhagia, mitigated by Nexplanon. Iron levels are not severely low, and platelet count improvement suggests enhanced iron status. - Continue oral iron supplementation, advised to take at night with orange juice post-dinner for optimal absorption. - Reassess blood counts in four months. - Follow up in eight months.  Thrombocytosis Thrombocytosis is resolving, with platelet count decreasing from 580 to 494, nearing normal limits, likely due to iron supplementation.  Vitamin D  deficiency Vitamin D  levels were critically low in January. Current levels are pending. She reports fatigue and low mood, potentially linked to vitamin D  deficiency. - Review vitamin D  levels upon availability. - Consider high-dose vitamin D  supplementation if levels remain low.   Plan - Lab reviewed, thrombocytosis has improved - Continue oral iron, I encouraged her to take it daily - Repeat lab every 4 months, follow-up in 8 months   Discussed the use of AI scribe software for clinical note transcription with the patient, who gave verbal consent to proceed.  History of Present Illness Angela Tate is a 28 year old female with iron deficiency and thrombocytosis who presents for follow-up.  She takes oral iron inconsistently, approximately half a day, usually in the morning, without side effects. Recent laboratory results show platelet  counts of 580 and 494, with a low MCV indicating iron deficiency. Her menstrual periods are sometimes heavy, with the last one being heavy and lasting about five days. She uses Nexplanon, resulting in periods every other month or sometimes not at all.  She experiences drowsiness, fatigue, and a lack of motivation to leave the house. Her vitamin D  was very low in January, but she has not been taking any vitamin D  supplements.     All other systems were reviewed with the patient and are negative.  MEDICAL HISTORY:  Past Medical History:  Diagnosis Date   Allergy    Diabetes mellitus without complication (HCC)    Hypertension    Insulin  resistance    Obesity    Prediabetes    Tonsillar hypertrophy     SURGICAL HISTORY: Past Surgical History:  Procedure Laterality Date   HERNIA REPAIR     TONSILLECTOMY Bilateral 04/27/2016   Procedure: TONSILLECTOMY;  Surgeon: Vaughan Ricker, MD;  Location: [facility] OR;  Service: ENT;  Laterality: Bilateral;  30 mins    I have reviewed the social history and family history with the patient and they are unchanged from previous note.  ALLERGIES:  has no known allergies.  MEDICATIONS:  Current Outpatient Medications  Medication Sig Dispense Refill   atorvastatin (LIPITOR) 10 MG tablet Take 10 mg by mouth daily.     cephALEXin  (KEFLEX ) 500 MG capsule Take 1 capsule (500 mg total) by mouth 2 (two) times daily. 10 capsule 0   fluconazole  (DIFLUCAN ) 150 MG tablet Take 1 tablet (150 mg total) by mouth every 3 (three) days. 5 tablet 0   ibuprofen  (ADVIL ) 800 MG tablet Take 1 tablet (800 mg total) by mouth every 8 (eight)  hours as needed. (Patient not taking: Reported on 01/17/2024) 30 tablet 0   Insulin  Degludec (TRESIBA FLEXTOUCH [place]) Inject into the skin.     losartan (COZAAR) 50 MG tablet Take 50 mg by mouth daily.     metFORMIN  (GLUCOPHAGE  XR) 750 MG 24 hr tablet Take 2 tablets daily (Patient not taking: Reported on 01/17/2024) 180 tablet 1   naproxen  (NAPROSYN )  500 MG tablet Take 1 tablet (500 mg total) by mouth 2 (two) times daily with a meal. (Patient not taking: Reported on 01/17/2024) 30 tablet 0   No current facility-administered medications for this visit.    PHYSICAL EXAMINATION: ECOG PERFORMANCE STATUS: 0 - Asymptomatic  Vitals:   03/16/24 1331  BP: 132/88  Pulse: (!) 103  Resp: 20  Temp: 97.9 F (36.6 C)  SpO2: 99%   Wt Readings from Last 3 Encounters:  03/16/24 (!) 327 lb 1.6 oz (148.4 kg)  10/15/23 (!) 321 lb 4.8 oz (145.7 kg)  11/22/17 283 lb (128.4 kg) (>99%, Z= 2.73)*   * Growth percentiles are based on CDC (Girls, 2-20 Years) data.     GENERAL:alert, no distress and comfortable SKIN: skin color, texture, turgor are normal, no rashes or significant lesions EYES: normal, Conjunctiva are pink and non-injected, sclera clear Musculoskeletal:no cyanosis of digits and no clubbing  NEURO: alert & oriented x 3 with fluent speech, no focal motor/sensory deficits  Physical Exam    LABORATORY DATA:  I have reviewed the data as listed    Latest Ref Rng & Units 03/16/2024    1:16 PM 10/15/2023    4:16 PM 04/24/2016    9:13 AM  CBC  WBC 4.0 - 10.5 K/uL 10.1  12.5  9.6   Hemoglobin 12.0 - 15.0 g/dL 87.9  87.7  88.8   Hematocrit 36.0 - 46.0 % 37.0  39.0  34.7   Platelets 150 - 400 K/uL 494  544  581         Latest Ref Rng & Units 04/24/2016   12:01 AM 03/09/2014   10:19 AM 09/22/2012    4:28 PM  CMP  Glucose 70 - 99 mg/dL 81  82  78   BUN 7 - 20 mg/dL 10  10  11    Creatinine 0.50 - 1.00 mg/dL 9.38  9.29  9.38   Sodium 135 - 146 mmol/L 138  138  137   Potassium 3.8 - 5.1 mmol/L 4.6  4.4  4.5   Chloride 98 - 110 mmol/L 103  104  104   CO2 20 - 31 mmol/L 24  23  27    Calcium 8.9 - 10.4 mg/dL 9.4  9.6  9.5   Total Protein 6.3 - 8.2 g/dL 7.0  6.8  6.9   Total Bilirubin 0.2 - 1.1 mg/dL 0.2  0.2  0.2   Alkaline Phos 47 - 176 U/L 65  66  64   AST 12 - 32 U/L 13  13  11    ALT 5 - 32 U/L 12  8  8        RADIOGRAPHIC  STUDIES: I have personally reviewed the radiological images as listed and agreed with the findings in the report. No results found.    No orders of the defined types were placed in this encounter.  All questions were answered. The patient knows to call the clinic with any problems, questions or concerns. No barriers to learning was detected. The total time spent in the appointment was 15 minutes, including review of chart  and various tests results, discussions about plan of care and coordination of care plan     Onita Mattock, MD 03/16/2024

## 2024-03-16 NOTE — Assessment & Plan Note (Signed)
-   Likely reactive due to iron deficient anemia -I recommend over-the-counter oral iron -Will monitor her CBC.

## 2024-03-17 ENCOUNTER — Telehealth: Payer: Self-pay | Admitting: Hematology

## 2024-03-17 NOTE — Telephone Encounter (Signed)
 Scheduled appointments per 6/23 los. Called and left a VM with appointment details for the patient.

## 2024-03-18 ENCOUNTER — Institutional Professional Consult (permissible substitution) (INDEPENDENT_AMBULATORY_CARE_PROVIDER_SITE_OTHER): Admitting: Otolaryngology

## 2024-07-16 ENCOUNTER — Inpatient Hospital Stay: Attending: Hematology

## 2024-07-16 DIAGNOSIS — D75839 Thrombocytosis, unspecified: Secondary | ICD-10-CM | POA: Diagnosis not present

## 2024-07-16 DIAGNOSIS — D509 Iron deficiency anemia, unspecified: Secondary | ICD-10-CM | POA: Insufficient documentation

## 2024-07-16 DIAGNOSIS — E559 Vitamin D deficiency, unspecified: Secondary | ICD-10-CM | POA: Insufficient documentation

## 2024-07-16 DIAGNOSIS — D649 Anemia, unspecified: Secondary | ICD-10-CM

## 2024-07-16 LAB — CBC WITH DIFFERENTIAL (CANCER CENTER ONLY)
Abs Immature Granulocytes: 0.18 K/uL — ABNORMAL HIGH (ref 0.00–0.07)
Basophils Absolute: 0.1 K/uL (ref 0.0–0.1)
Basophils Relative: 0 %
Eosinophils Absolute: 0.2 K/uL (ref 0.0–0.5)
Eosinophils Relative: 2 %
HCT: 36.8 % (ref 36.0–46.0)
Hemoglobin: 12.1 g/dL (ref 12.0–15.0)
Immature Granulocytes: 2 %
Lymphocytes Relative: 26 %
Lymphs Abs: 3 K/uL (ref 0.7–4.0)
MCH: 24.6 pg — ABNORMAL LOW (ref 26.0–34.0)
MCHC: 32.9 g/dL (ref 30.0–36.0)
MCV: 74.9 fL — ABNORMAL LOW (ref 80.0–100.0)
Monocytes Absolute: 0.6 K/uL (ref 0.1–1.0)
Monocytes Relative: 5 %
Neutro Abs: 7.6 K/uL (ref 1.7–7.7)
Neutrophils Relative %: 65 %
Platelet Count: 507 K/uL — ABNORMAL HIGH (ref 150–400)
RBC: 4.91 MIL/uL (ref 3.87–5.11)
RDW: 15.3 % (ref 11.5–15.5)
WBC Count: 11.6 K/uL — ABNORMAL HIGH (ref 4.0–10.5)
nRBC: 0 % (ref 0.0–0.2)

## 2024-07-16 LAB — FERRITIN: Ferritin: 74 ng/mL (ref 11–307)

## 2024-07-16 LAB — IRON AND IRON BINDING CAPACITY (CC-WL,HP ONLY)
Iron: 35 ug/dL (ref 28–170)
Saturation Ratios: 8 % — ABNORMAL LOW (ref 10.4–31.8)
TIBC: 448 ug/dL (ref 250–450)
UIBC: 413 ug/dL (ref 148–442)

## 2024-07-16 LAB — VITAMIN D 25 HYDROXY (VIT D DEFICIENCY, FRACTURES): Vit D, 25-Hydroxy: 13.88 ng/mL — ABNORMAL LOW (ref 30–100)

## 2024-11-16 ENCOUNTER — Inpatient Hospital Stay: Admitting: Hematology

## 2024-11-16 ENCOUNTER — Inpatient Hospital Stay
